# Patient Record
Sex: Female | Born: 1940 | Race: White | Hispanic: Refuse to answer | Marital: Married | Smoking: Never smoker
Health system: Southern US, Community
[De-identification: ages and names within clinical notes are randomized; demographics above are authoritative.]

## PROBLEM LIST (undated history)

## (undated) DIAGNOSIS — F411 Generalized anxiety disorder: Secondary | ICD-10-CM

## (undated) DIAGNOSIS — E039 Hypothyroidism, unspecified: Secondary | ICD-10-CM

## (undated) DIAGNOSIS — K5790 Diverticulosis of intestine, part unspecified, without perforation or abscess without bleeding: Secondary | ICD-10-CM

## (undated) DIAGNOSIS — I5189 Other ill-defined heart diseases: Secondary | ICD-10-CM

## (undated) DIAGNOSIS — H269 Unspecified cataract: Secondary | ICD-10-CM

## (undated) DIAGNOSIS — M1990 Unspecified osteoarthritis, unspecified site: Secondary | ICD-10-CM

## (undated) DIAGNOSIS — F32A Depression, unspecified: Secondary | ICD-10-CM

## (undated) DIAGNOSIS — H409 Unspecified glaucoma: Secondary | ICD-10-CM

## (undated) DIAGNOSIS — I10 Essential (primary) hypertension: Secondary | ICD-10-CM

## (undated) DIAGNOSIS — C4491 Basal cell carcinoma of skin, unspecified: Secondary | ICD-10-CM

## (undated) DIAGNOSIS — R51 Headache: Secondary | ICD-10-CM

## (undated) DIAGNOSIS — E7800 Pure hypercholesterolemia, unspecified: Secondary | ICD-10-CM

## (undated) DIAGNOSIS — F329 Major depressive disorder, single episode, unspecified: Secondary | ICD-10-CM

## (undated) DIAGNOSIS — K219 Gastro-esophageal reflux disease without esophagitis: Secondary | ICD-10-CM

## (undated) DIAGNOSIS — M8580 Other specified disorders of bone density and structure, unspecified site: Secondary | ICD-10-CM

## (undated) DIAGNOSIS — R079 Chest pain, unspecified: Secondary | ICD-10-CM

## (undated) HISTORY — DX: Basal cell carcinoma of skin, unspecified: C44.91

## (undated) HISTORY — DX: Other ill-defined heart diseases: I51.89

## (undated) HISTORY — PX: TUBAL LIGATION: SHX77

## (undated) HISTORY — DX: Chest pain, unspecified: R07.9

## (undated) HISTORY — DX: Depression, unspecified: F32.A

## (undated) HISTORY — PX: EYE SURGERY: SHX253

## (undated) HISTORY — DX: Headache: R51

## (undated) HISTORY — DX: Diverticulosis of intestine, part unspecified, without perforation or abscess without bleeding: K57.90

## (undated) HISTORY — DX: Generalized anxiety disorder: F41.1

## (undated) HISTORY — DX: Hypothyroidism, unspecified: E03.9

## (undated) HISTORY — DX: Essential (primary) hypertension: I10

## (undated) HISTORY — DX: Pure hypercholesterolemia, unspecified: E78.00

## (undated) HISTORY — DX: Unspecified cataract: H26.9

## (undated) HISTORY — DX: Unspecified glaucoma: H40.9

## (undated) HISTORY — DX: Unspecified osteoarthritis, unspecified site: M19.90

## (undated) HISTORY — DX: Other specified disorders of bone density and structure, unspecified site: M85.80

## (undated) HISTORY — DX: Major depressive disorder, single episode, unspecified: F32.9

## (undated) HISTORY — DX: Gastro-esophageal reflux disease without esophagitis: K21.9

---
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
## 1989-10-21 HISTORY — PX: TOTAL ABDOMINAL HYSTERECTOMY: SHX209

## 1989-10-21 HISTORY — PX: OTHER SURGICAL HISTORY: SHX169

## 2004-08-08 ENCOUNTER — Ambulatory Visit: Payer: Self-pay | Admitting: Internal Medicine

## 2005-06-05 ENCOUNTER — Ambulatory Visit: Payer: Self-pay | Admitting: Internal Medicine

## 2006-06-06 ENCOUNTER — Ambulatory Visit: Payer: Self-pay | Admitting: Internal Medicine

## 2007-06-09 ENCOUNTER — Ambulatory Visit: Payer: Self-pay | Admitting: Internal Medicine

## 2007-08-14 ENCOUNTER — Ambulatory Visit: Payer: Self-pay

## 2008-06-10 ENCOUNTER — Ambulatory Visit: Payer: Self-pay | Admitting: Internal Medicine

## 2008-10-21 HISTORY — PX: OTHER SURGICAL HISTORY: SHX169

## 2010-01-10 ENCOUNTER — Ambulatory Visit: Payer: Self-pay | Admitting: Family Medicine

## 2010-11-05 LAB — HM DEXA SCAN

## 2011-01-16 ENCOUNTER — Ambulatory Visit: Payer: Self-pay | Admitting: Family Medicine

## 2012-02-04 ENCOUNTER — Ambulatory Visit: Payer: Self-pay | Admitting: Family Medicine

## 2012-07-12 ENCOUNTER — Encounter: Payer: Self-pay | Admitting: *Deleted

## 2012-07-13 ENCOUNTER — Encounter: Payer: Self-pay | Admitting: Family Medicine

## 2012-07-13 ENCOUNTER — Ambulatory Visit (INDEPENDENT_AMBULATORY_CARE_PROVIDER_SITE_OTHER): Payer: Medicare Other | Admitting: Family Medicine

## 2012-07-13 VITALS — BP 140/82 | HR 52 | Temp 98.2°F | Resp 16 | Ht 63.5 in | Wt 157.6 lb

## 2012-07-13 DIAGNOSIS — E7800 Pure hypercholesterolemia, unspecified: Secondary | ICD-10-CM

## 2012-07-13 DIAGNOSIS — E039 Hypothyroidism, unspecified: Secondary | ICD-10-CM

## 2012-07-13 DIAGNOSIS — F32A Depression, unspecified: Secondary | ICD-10-CM

## 2012-07-13 DIAGNOSIS — Z23 Encounter for immunization: Secondary | ICD-10-CM

## 2012-07-13 DIAGNOSIS — I10 Essential (primary) hypertension: Secondary | ICD-10-CM

## 2012-07-13 DIAGNOSIS — F329 Major depressive disorder, single episode, unspecified: Secondary | ICD-10-CM

## 2012-07-13 LAB — T4, FREE: Free T4: 1.03 ng/dL (ref 0.80–1.80)

## 2012-07-13 LAB — CBC WITH DIFFERENTIAL/PLATELET
Basophils Absolute: 0 10*3/uL (ref 0.0–0.1)
HCT: 43.6 % (ref 36.0–46.0)
Hemoglobin: 14.7 g/dL (ref 12.0–15.0)
Lymphocytes Relative: 34 % (ref 12–46)
Monocytes Absolute: 0.4 10*3/uL (ref 0.1–1.0)
Monocytes Relative: 5 % (ref 3–12)
Neutro Abs: 4.1 10*3/uL (ref 1.7–7.7)
RDW: 13.9 % (ref 11.5–15.5)
WBC: 7.2 10*3/uL (ref 4.0–10.5)

## 2012-07-13 LAB — COMPREHENSIVE METABOLIC PANEL
Albumin: 4.4 g/dL (ref 3.5–5.2)
Alkaline Phosphatase: 88 U/L (ref 39–117)
BUN: 11 mg/dL (ref 6–23)
CO2: 26 mEq/L (ref 19–32)
Calcium: 9.7 mg/dL (ref 8.4–10.5)
Glucose, Bld: 83 mg/dL (ref 70–99)
Potassium: 4.4 mEq/L (ref 3.5–5.3)

## 2012-07-13 LAB — CK: Total CK: 126 U/L (ref 7–177)

## 2012-07-13 LAB — LIPID PANEL
Cholesterol: 178 mg/dL (ref 0–200)
Triglycerides: 185 mg/dL — ABNORMAL HIGH (ref <150)
VLDL: 37 mg/dL (ref 0–40)

## 2012-07-13 MED ORDER — LISINOPRIL 20 MG PO TABS: 20.0000 mg | ORAL_TABLET | Freq: Every day | ORAL | Status: DC

## 2012-07-13 MED ORDER — ALPRAZOLAM 0.5 MG PO TABS: 0.5000 mg | ORAL_TABLET | Freq: Every evening | ORAL | Status: DC | PRN

## 2012-07-13 NOTE — Progress Notes (Signed)
[address]   [place], [place]  [address]   [phone]  Subjective:    Patient ID: Regina Evans, female    DOB: [birth year], 71 y.o.   MRN: [id number]  HPI This 71 y.o. female presents to establish care and for follow-up:  1.  Hypertension: last visit six months ago.  No changes to management made at last visit.  Not checking blood pressure at home.   Denies chest pain, palpitations, shortness of breath, leg swelling.  Reports good compliance with medication, good tolerance of medication, good symptom control.  Needs refill of Lisinopril.  2.  Hyperlipidemia:  Last visit six months ago.  No changes made at last visit.  Reports good compliance of medication; good symptom control; good tolerance of medication.  Denies HA, focal weakness, paresthesias, dizziness.  3.  Hypothyroidism: last visit six months ago; increased Levothyroxine to one daily.  Reports good tolerance of medication; good symptom control; good tolerance to medication.  Denies weight changes, skin changes, hair changes.  4.  Depression:  Last visit six months ago; no changes in management made at last visit.  Emotionally stable; no major issues in past few months.  Would like to continue medication.  Using Alprazolam PRN insomnia; needs refill of Alprazolam.    5.  Flu vaccine:  Agreeable.  Did not check with insurance regarding Zostavax coverage.   Review of Systems  Constitutional: Negative for fever, chills, diaphoresis and fatigue.  Respiratory: Negative for cough, shortness of breath and wheezing.   Cardiovascular: Negative for chest pain, palpitations and leg swelling.  Gastrointestinal: Negative for nausea, vomiting, abdominal pain and diarrhea.  Neurological: Negative for dizziness, tremors, facial asymmetry, weakness, light-headedness, numbness and headaches.  Psychiatric/Behavioral: Positive for disturbed wake/sleep cycle and dysphoric mood. The patient is not nervous/anxious.         Past Medical  History  Diagnosis Date  . GERD (gastroesophageal reflux disease)   . Arthritis   . Osteoarthritis     hands  . Osteopenia   . Other anxiety states   . Unspecified hypothyroidism   . Headache     none since hysterectomy  . Essential hypertension, benign   . Pure hypercholesterolemia   . Unspecified glaucoma   . Depression   . Diverticulosis     no polyps in 2012 repeat colonoscopy in 5 years    Past Surgical History  Procedure Date  . Tonsillectomy 1949  . Maxillary and mandibular surgery 1991  . Total abdominal hysterectomy 1991    ovaries removed;fibroids  . Tube in right ear 2010    Prior to Admission medications   Medication Sig Start Date End Date Taking? Authorizing Provider  ALPRAZolam Prudy Feeler) 0.5 MG tablet Take 1 tablet (0.5 mg total) by mouth at bedtime as needed. NEED REFILLS 07/13/12  Yes Ethelda Chick, MD  aspirin 81 MG tablet Take 81 mg by mouth daily.   Yes Historical Provider, MD  cetirizine (ZYRTEC) 10 MG tablet Take 10 mg by mouth daily.   Yes Historical Provider, MD  citalopram (CELEXA) 20 MG tablet Take 20 mg by mouth daily.   Yes Historical Provider, MD  levothyroxine (SYNTHROID, LEVOTHROID) 75 MCG tablet Take 75 mcg by mouth daily.   Yes Historical Provider, MD  lisinopril (PRINIVIL,ZESTRIL) 20 MG tablet Take 1 tablet (20 mg total) by mouth daily. NEED REFILLS 07/13/12  Yes Ethelda Chick, MD  lovastatin (MEVACOR) 20 MG tablet Take 20 mg by mouth at bedtime.   Yes Historical  Provider, MD  timolol (BETIMOL) 0.5 % ophthalmic solution Place 1 drop into the left eye daily. USE QHS   Yes Historical Provider, MD    No Known Allergies  History   Social History  . Marital Status: Married    Spouse Name: N/A    Number of Children: 2  . Years of Education: N/A   Occupational History  . retired    Social History Main Topics  . Smoking status: Never Smoker   . Smokeless tobacco: Not on file  . Alcohol Use: Yes     moderate 5-6 drinks per week (1-2  drinks/wine or liquor each night  . Drug Use: No  . Sexually Active: Not on file   Other Topics Concern  . Not on file   Social History Narrative   Married x 50 years. Caffeine use 3 servings per day. Exercise: Inactive sporadic walking. Guns in the home stored in locked cabinet. Always uses seat belts.    Family History  Problem Relation Age of Onset  . Hypertension Mother   . Cancer Father     prostate  . Diabetes Brother   . Alzheimer's disease Mother     Hospice care  . Macular degeneration Mother   . Colon polyps Mother     Objective:   Physical Exam  Nursing note and vitals reviewed. Constitutional: She is oriented to person, place, and time. She appears well-developed and well-nourished. No distress.  HENT:  Head: Normocephalic and atraumatic.  Eyes: Conjunctivae normal are normal. Pupils are equal, round, and reactive to light.  Neck: Normal range of motion. Neck supple. No thyromegaly present.  Cardiovascular: Normal rate, regular rhythm, normal heart sounds and intact distal pulses.  Exam reveals no gallop and no friction rub.   No murmur heard. Pulmonary/Chest: Effort normal and breath sounds normal. No respiratory distress. She has no wheezes. She has no rales.  Abdominal: Soft. Bowel sounds are normal. She exhibits no distension and no mass. There is no tenderness. There is no rebound and no guarding.  Lymphadenopathy:    She has no cervical adenopathy.  Neurological: She is alert and oriented to person, place, and time. No cranial nerve deficit. She exhibits normal muscle tone.  Skin: Skin is warm and dry. No rash noted. She is not diaphoretic.  Psychiatric: She has a normal mood and affect. Her behavior is normal. Judgment and thought content normal.   FLU VACCINE ADMINISTERED BY [facility].    Assessment & Plan:   1. Pure hypercholesterolemia  Lipid panel  2. Essential hypertension, benign  CBC with Differential, CK, Comprehensive metabolic panel  3.  Unspecified hypothyroidism  TSH, T4, free  4. Need for prophylactic vaccination and inoculation against influenza  Flu vaccine greater than or equal to 3yo preservative free IM  5. Depression       1. Hypercholesterolemia:  Controlled; no change in medications; obtain labs. 2.  Hypertension: controlled; no change in medications; obtain labs.  Refill provided. 3.  Hypothyroidism: controlled; no change in medications; obtain labs. 4. Depression with anxiety: stable; no change in medications; refill provided. 5.  S/p influenza vaccine; to check with insurance regarding Zostavax coverage.

## 2012-07-13 NOTE — Patient Instructions (Addendum)
1. Pure hypercholesterolemia  Lipid panel  2. Essential hypertension, benign  CBC with Differential, CK, Comprehensive metabolic panel  3. Unspecified hypothyroidism  TSH, T4, free  4. Need for prophylactic vaccination and inoculation against influenza  Flu vaccine greater than or equal to 69yo preservative free IM  5. Depression       CALL INSURANCE REGARDING SHINGLES VACCINE COVERAGE.

## 2012-07-20 NOTE — Progress Notes (Signed)
Reviewed and agree.

## 2012-09-22 ENCOUNTER — Other Ambulatory Visit: Payer: Self-pay | Admitting: Family Medicine

## 2012-09-29 ENCOUNTER — Other Ambulatory Visit: Payer: Self-pay | Admitting: Family Medicine

## 2012-11-25 ENCOUNTER — Other Ambulatory Visit: Payer: Self-pay | Admitting: Family Medicine

## 2012-11-26 ENCOUNTER — Other Ambulatory Visit: Payer: Self-pay | Admitting: *Deleted

## 2012-11-26 MED ORDER — LOVASTATIN 20 MG PO TABS: 20.0000 mg | ORAL_TABLET | Freq: Every day | ORAL | Status: DC

## 2012-12-04 ENCOUNTER — Other Ambulatory Visit: Payer: Self-pay | Admitting: Family Medicine

## 2013-01-18 ENCOUNTER — Ambulatory Visit (INDEPENDENT_AMBULATORY_CARE_PROVIDER_SITE_OTHER): Payer: Medicare Other | Admitting: Family Medicine

## 2013-01-18 ENCOUNTER — Encounter: Payer: Self-pay | Admitting: Family Medicine

## 2013-01-18 VITALS — BP 114/76 | HR 58 | Temp 98.2°F | Resp 16 | Ht 63.5 in | Wt 162.3 lb

## 2013-01-18 DIAGNOSIS — E7800 Pure hypercholesterolemia, unspecified: Secondary | ICD-10-CM | POA: Insufficient documentation

## 2013-01-18 DIAGNOSIS — Z1239 Encounter for other screening for malignant neoplasm of breast: Secondary | ICD-10-CM | POA: Insufficient documentation

## 2013-01-18 DIAGNOSIS — N6489 Other specified disorders of breast: Secondary | ICD-10-CM | POA: Insufficient documentation

## 2013-01-18 DIAGNOSIS — I10 Essential (primary) hypertension: Secondary | ICD-10-CM | POA: Insufficient documentation

## 2013-01-18 DIAGNOSIS — E039 Hypothyroidism, unspecified: Secondary | ICD-10-CM

## 2013-01-18 DIAGNOSIS — F418 Other specified anxiety disorders: Secondary | ICD-10-CM

## 2013-01-18 DIAGNOSIS — J309 Allergic rhinitis, unspecified: Secondary | ICD-10-CM | POA: Insufficient documentation

## 2013-01-18 LAB — CBC WITH DIFFERENTIAL/PLATELET
Basophils Relative: 1 % (ref 0–1)
Eosinophils Absolute: 0.3 10*3/uL (ref 0.0–0.7)
Hemoglobin: 14.5 g/dL (ref 12.0–15.0)
Lymphs Abs: 2.4 10*3/uL (ref 0.7–4.0)
MCH: 29.7 pg (ref 26.0–34.0)
MCHC: 33.7 g/dL (ref 30.0–36.0)
Neutro Abs: 3.7 10*3/uL (ref 1.7–7.7)
Neutrophils Relative %: 54 % (ref 43–77)
Platelets: 244 10*3/uL (ref 150–400)
RBC: 4.88 MIL/uL (ref 3.87–5.11)

## 2013-01-18 LAB — COMPREHENSIVE METABOLIC PANEL
ALT: 15 U/L (ref 0–35)
Albumin: 4.2 g/dL (ref 3.5–5.2)
Alkaline Phosphatase: 82 U/L (ref 39–117)
Potassium: 4.5 mEq/L (ref 3.5–5.3)
Sodium: 140 mEq/L (ref 135–145)
Total Bilirubin: 0.4 mg/dL (ref 0.3–1.2)
Total Protein: 6.8 g/dL (ref 6.0–8.3)

## 2013-01-18 LAB — LIPID PANEL
LDL Cholesterol: 78 mg/dL (ref 0–99)
VLDL: 38 mg/dL (ref 0–40)

## 2013-01-18 LAB — TSH: TSH: 1.681 u[IU]/mL (ref 0.350–4.500)

## 2013-01-18 LAB — CK: Total CK: 108 U/L (ref 7–177)

## 2013-01-18 MED ORDER — LEVOTHYROXINE SODIUM 75 MCG PO TABS: 75.0000 ug | ORAL_TABLET | Freq: Every day | ORAL | Status: DC

## 2013-01-18 MED ORDER — LOVASTATIN 20 MG PO TABS: 20.0000 mg | ORAL_TABLET | Freq: Every day | ORAL | Status: DC

## 2013-01-18 NOTE — Assessment & Plan Note (Signed)
Controlled; no change in management; obtain labs. 

## 2013-01-18 NOTE — Patient Instructions (Addendum)
Breast cancer screening - Plan: MM Digital Screening  Essential hypertension, benign - Plan: CBC with Differential, Comprehensive metabolic panel, Lipid panel, TSH  Pure hypercholesterolemia - Plan: Lipid panel  Unspecified hypothyroidism - Plan: Comprehensive metabolic panel, TSH, CK

## 2013-01-18 NOTE — Assessment & Plan Note (Signed)
Controlled; obtain labs; continue current medications. 

## 2013-01-18 NOTE — Assessment & Plan Note (Signed)
Stable; recently decreased medication to 1/2 tablet daily; doing well.  Does not desire trial off of medication.

## 2013-01-18 NOTE — Assessment & Plan Note (Signed)
Controlled; obtain labs; refill provided. 

## 2013-01-18 NOTE — Assessment & Plan Note (Signed)
Worsening; restarted Zyrtec recently.

## 2013-01-18 NOTE — Progress Notes (Signed)
[facility]  Subjective:    Patient ID: Regina Evans, female    DOB: 1941/01/08, 72 y.o.   MRN: [id number]  HPI This 72 y.o. female presents for evaluation of the following:  1. HTN:  BP running unknown; no high or low readings.  Reports compliance with medication; good tolerance to medication; good symptom control. Denies CP/palp/SOB/leg swelling. Denies HA/paresthesias/focal weakness/dizziness.  2.  Allergic Rhinitis:  Zyrtec daily. Has started suffering with nasal congestion, rhinorrhea, sneezing, PND.  Stable at this time.  3. Hypothyroidism: six month follow-up; no changes to management made at last visit; TSH therapeutic at last visit; reports compliance with medication; good tolerance to medication; good symptom control.  4.  Hypercholesterolemia: fasting.  Six month follow-up; no changes to management made at last visit; reports compliance with medication; good tolerance to medication; good symptom control.  5.  Depression and anxiety:  Reduced Citalopram to 1/2 tablet daily two weeks ago; doing well emotionally; not interested in completely stopping medication.   6.  Health Maintenance:  Needs to schedule mammogram.   [facility].  Due for CPE.  7.  R breast asymmetry:  No palpable lump but breast on R lateral aspect feels different.   Review of Systems  Constitutional: Negative for fever, chills, diaphoresis and fatigue.  HENT: Positive for congestion, rhinorrhea, sneezing and postnasal drip. Negative for ear pain and sinus pressure.   Respiratory: Negative for shortness of breath.   Cardiovascular: Negative for chest pain, palpitations and leg swelling.  Neurological: Negative for dizziness, tremors, seizures, syncope, facial asymmetry, speech difficulty, weakness, light-headedness, numbness and headaches.  Psychiatric/Behavioral: Positive for dysphoric mood. The patient is nervous/anxious.         Past Medical History  Diagnosis Date  . GERD (gastroesophageal reflux  disease)   . Arthritis   . Osteoarthritis     hands  . Osteopenia   . Other anxiety states   . Unspecified hypothyroidism   . Headache     none since hysterectomy  . Essential hypertension, benign   . Pure hypercholesterolemia   . Unspecified glaucoma(365.9)   . Depression   . Diverticulosis     no polyps in 2012 repeat colonoscopy in 5 years  . Cataract     Past Surgical History  Procedure Laterality Date  . Tonsillectomy  1949  . Maxillary and mandibular surgery  1991  . Total abdominal hysterectomy  1991    ovaries removed;fibroids  . Tube in right ear  2010  . Tubal ligation      Prior to Admission medications   Medication Sig Start Date End Date Taking? Authorizing Provider  ALPRAZolam Prudy Feeler) 0.5 MG tablet Take 1 tablet (0.5 mg total) by mouth at bedtime as needed. NEED REFILLS 07/13/12  Yes Ethelda Chick, MD  aspirin 81 MG tablet Take 81 mg by mouth daily.   Yes Historical Provider, MD  cetirizine (ZYRTEC) 10 MG tablet Take 10 mg by mouth daily.   Yes Historical Provider, MD  citalopram (CELEXA) 20 MG tablet Take 20 mg by mouth daily.   Yes Historical Provider, MD  levothyroxine (SYNTHROID, LEVOTHROID) 75 MCG tablet Take 1 tablet (75 mcg total) by mouth daily. 01/18/13  Yes Ethelda Chick, MD  lisinopril (PRINIVIL,ZESTRIL) 20 MG tablet Take 1 tablet (20 mg total) by mouth daily. NEED REFILLS 07/13/12  Yes Ethelda Chick, MD  lovastatin (MEVACOR) 20 MG tablet Take 1 tablet (20 mg total) by mouth at bedtime. 01/18/13  Yes Ethelda Chick, MD  timolol (BETIMOL) 0.5 % ophthalmic solution Place 1 drop into the left eye daily. USE QHS   Yes Historical Provider, MD    No Known Allergies  History   Social History  . Marital Status: Married    Spouse Name: N/A    Number of Children: 2  . Years of Education: N/A   Occupational History  . retired    Social History Main Topics  . Smoking status: Never Smoker   . Smokeless tobacco: Not on file  . Alcohol Use: Yes      Comment: moderate 5-6 drinks per week (1-2 drinks/wine or liquor each night  . Drug Use: No  . Sexually Active: Yes    Birth Control/ Protection: None     Comment: number of sex partners in the last 12 months  1   Other Topics Concern  . Not on file   Social History Narrative   Married x 50 years. Caffeine use 3 servings per day. Exercise: Inactive sporadic walking. Guns in the home stored in locked cabinet. Always uses seat belts.    Family History  Problem Relation Age of Onset  . Hypertension Mother   . Alzheimer's disease Mother     Hospice care  . Macular degeneration Mother   . Colon polyps Mother   . Cancer Father     prostate  . Diabetes Brother   . Alzheimer's disease Maternal Grandmother   . Cancer Paternal Grandmother   . Dementia Paternal Grandfather   . Cirrhosis Paternal Grandfather     Objective:   Physical Exam  Nursing note and vitals reviewed. Constitutional: She is oriented to person, place, and time. She appears well-developed and well-nourished. No distress.  HENT:  Mouth/Throat: Oropharynx is clear and moist.  Eyes: Conjunctivae and EOM are normal. Pupils are equal, round, and reactive to light.  Neck: Normal range of motion. Neck supple. No JVD present. No tracheal deviation present. No thyromegaly present.  Cardiovascular: Normal rate, regular rhythm and normal heart sounds.  Exam reveals no gallop and no friction rub.   No murmur heard. Pulmonary/Chest: Effort normal and breath sounds normal. She has no wheezes. She has no rales.  Genitourinary: No breast swelling, tenderness, discharge or bleeding.  Lymphadenopathy:    She has no cervical adenopathy.  Neurological: She is alert and oriented to person, place, and time.  Skin: She is not diaphoretic.  Psychiatric: She has a normal mood and affect. Her behavior is normal. Judgment and thought content normal.       Assessment & Plan:  Breast cancer screening - Plan: MM Digital  Screening  Essential hypertension, benign - Plan: CBC with Differential, Comprehensive metabolic panel, Lipid panel, TSH  Pure hypercholesterolemia - Plan: Lipid panel  Unspecified hypothyroidism - Plan: Comprehensive metabolic panel, TSH, CK  Depression with anxiety  Allergic rhinitis  Breast asymmetry   Meds ordered this encounter  Medications  . levothyroxine (SYNTHROID, LEVOTHROID) 75 MCG tablet    Sig: Take 1 tablet (75 mcg total) by mouth daily.    Dispense:  90 tablet    Refill:  3  . lovastatin (MEVACOR) 20 MG tablet    Sig: Take 1 tablet (20 mg total) by mouth at bedtime.    Dispense:  90 tablet    Refill:  3   s

## 2013-01-18 NOTE — Assessment & Plan Note (Signed)
New. Benign breast exam; refer for annual mammogram.

## 2013-01-18 NOTE — Assessment & Plan Note (Signed)
Refer for mammogram at [facility].

## 2013-02-25 ENCOUNTER — Ambulatory Visit: Payer: Self-pay | Admitting: Family Medicine

## 2013-03-16 ENCOUNTER — Encounter: Payer: Self-pay | Admitting: Family Medicine

## 2013-03-17 NOTE — Telephone Encounter (Signed)
error 

## 2013-03-19 MED ORDER — HYDROCORTISONE ACETATE 25 MG RE SUPP: 25.0000 mg | Freq: Two times a day (BID) | RECTAL | Status: DC

## 2013-03-19 NOTE — Addendum Note (Signed)
Addended by: Ethelda Chick on: 03/19/2013 12:27 PM   Modules accepted: Orders

## 2013-03-20 ENCOUNTER — Encounter: Payer: Self-pay | Admitting: Family Medicine

## 2013-03-24 ENCOUNTER — Ambulatory Visit: Payer: Medicare Other | Admitting: Family Medicine

## 2013-04-08 ENCOUNTER — Encounter: Payer: Self-pay | Admitting: Radiology

## 2013-04-08 ENCOUNTER — Telehealth: Payer: Self-pay | Admitting: Radiology

## 2013-04-08 NOTE — Telephone Encounter (Signed)
mychart message sent to patient mammogram normal

## 2013-04-15 ENCOUNTER — Encounter: Payer: Self-pay | Admitting: Family Medicine

## 2013-05-26 ENCOUNTER — Other Ambulatory Visit: Payer: Self-pay

## 2013-06-15 IMAGING — MG MM CAD SCREENING MAMMO
1 series · 5 of 5 positions shown · non-contrast
Comparison: none

REASON FOR EXAM: SCR MAMMO NO ORDER
COMMENTS:

PROCEDURE:     MAM - MAM DGTL SCRN MAM NO ORDER W/CAD  - February 25, 2013  [DATE]
RESULT:     Bilateral digital screening mammogram with CAD dated 02/25/2013
Comparison study/studies dated:
02/04/2012, 01/16/2011, 01/10/2010, 06/17/2008

[R CC · right · 5 of 5 slices shown]
[im 1/5]
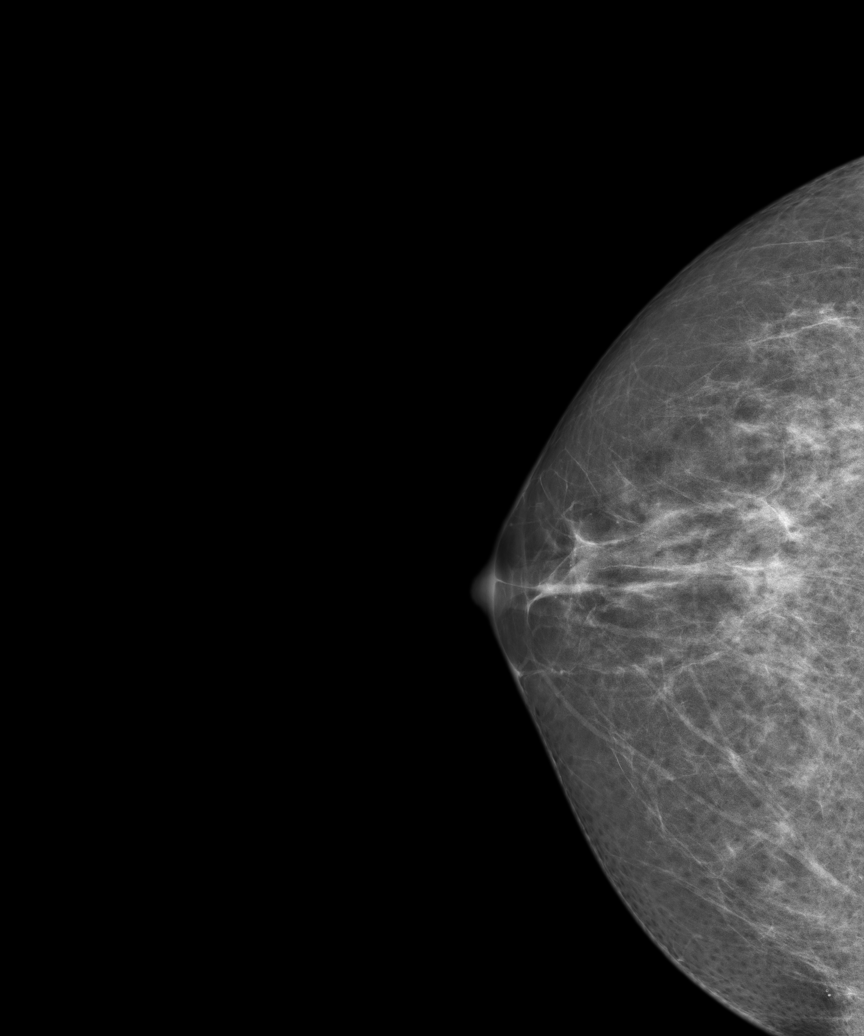
[im 2/5]
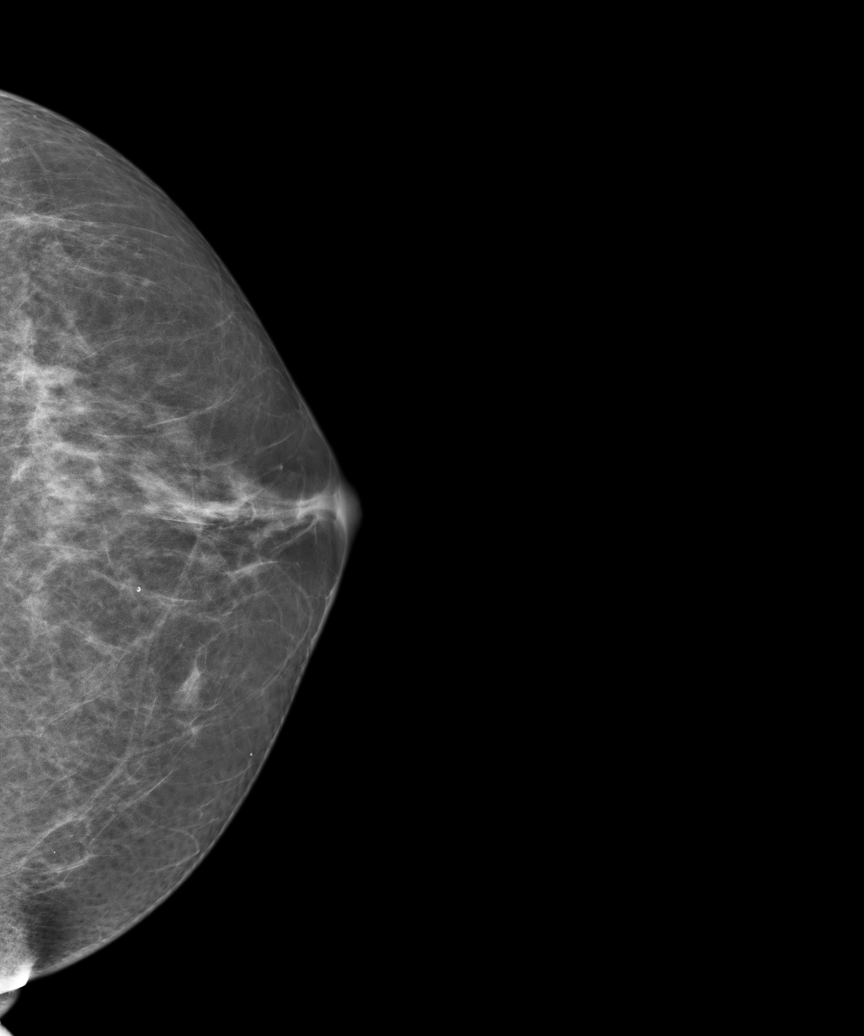
[im 3/5]
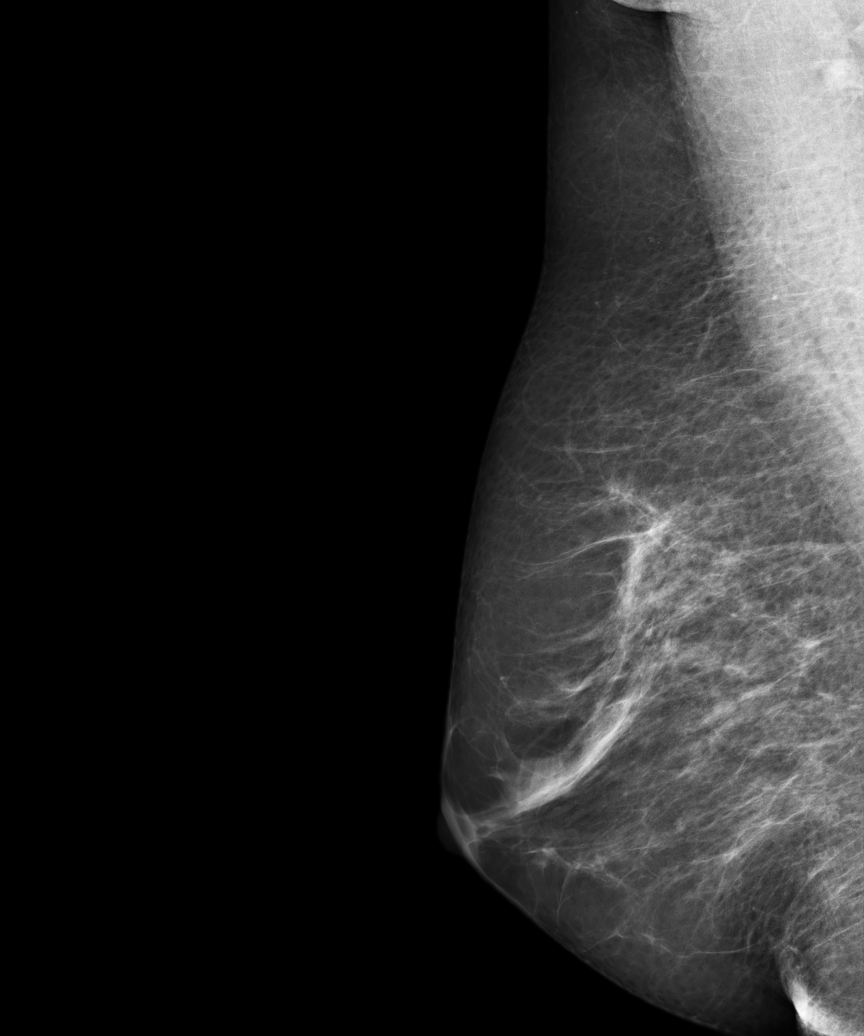
[im 4/5]
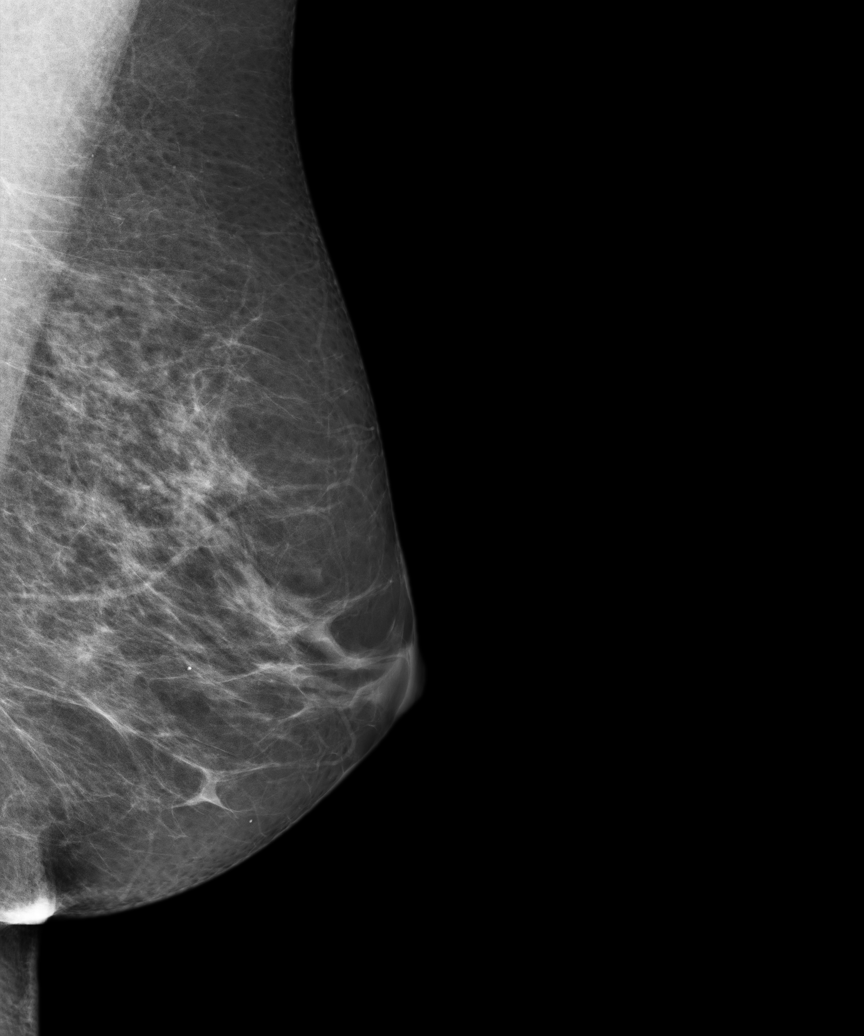
[im 5/5]
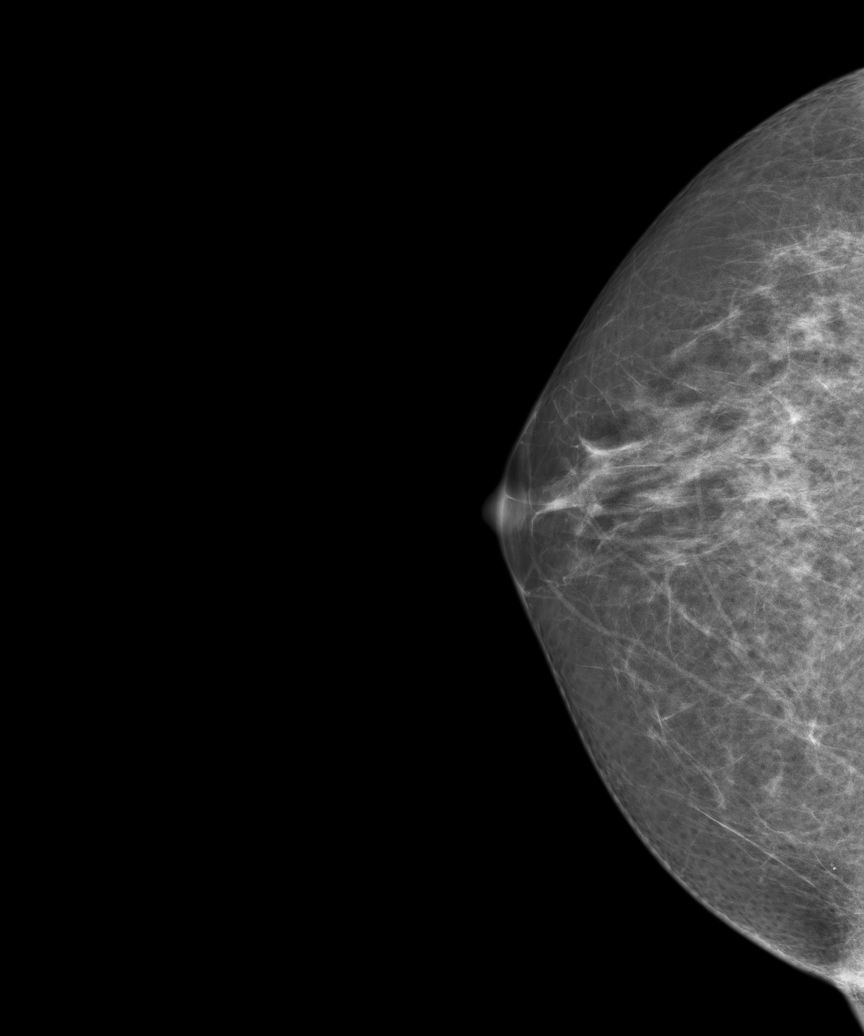

[5 of 5 positions shown; findings below may reference images not displayed]

FINDINGS: BREAST COMPOSITION: The breast composition is SCATTERED FIBROGLANDULAR
TISSUE (glandular tissue is 25-50%)

There is no radiographic evidence of malignant type calcifications ,
architectural distortion, spiculated masses or nodules.
IMPRESSION: BI-RADS: Category 2- Benign Finding

A NEGATIVE MAMMOGRAM REPORT DOES NOT PRECLUDE BIOPSY OR OTHER EVALUATION OF
A CLINICALLY PALPABLE OR OTHERWISE SUSPICIOUS MASS OR LESION. BREAST CANCER
MAY NOT BE DETECTED IN UP TO 10% OF CASES.

## 2013-08-02 ENCOUNTER — Encounter: Payer: Medicare Other | Admitting: Family Medicine

## 2013-08-11 ENCOUNTER — Ambulatory Visit (INDEPENDENT_AMBULATORY_CARE_PROVIDER_SITE_OTHER): Payer: Medicare Other | Admitting: Family Medicine

## 2013-08-11 ENCOUNTER — Encounter: Payer: Self-pay | Admitting: Family Medicine

## 2013-08-11 VITALS — HR 63 | Temp 98.4°F | Resp 16 | Ht 63.0 in | Wt 163.0 lb

## 2013-08-11 DIAGNOSIS — Z23 Encounter for immunization: Secondary | ICD-10-CM

## 2013-08-11 DIAGNOSIS — E039 Hypothyroidism, unspecified: Secondary | ICD-10-CM

## 2013-08-11 DIAGNOSIS — Z131 Encounter for screening for diabetes mellitus: Secondary | ICD-10-CM

## 2013-08-11 DIAGNOSIS — N898 Other specified noninflammatory disorders of vagina: Secondary | ICD-10-CM

## 2013-08-11 DIAGNOSIS — E7800 Pure hypercholesterolemia, unspecified: Secondary | ICD-10-CM

## 2013-08-11 DIAGNOSIS — I10 Essential (primary) hypertension: Secondary | ICD-10-CM

## 2013-08-11 DIAGNOSIS — N949 Unspecified condition associated with female genital organs and menstrual cycle: Secondary | ICD-10-CM

## 2013-08-11 DIAGNOSIS — Z0000 Encounter for general adult medical examination without abnormal findings: Secondary | ICD-10-CM

## 2013-08-11 DIAGNOSIS — F329 Major depressive disorder, single episode, unspecified: Secondary | ICD-10-CM

## 2013-08-11 LAB — CBC WITH DIFFERENTIAL/PLATELET
Eosinophils Relative: 4 % (ref 0–5)
HCT: 41.8 % (ref 36.0–46.0)
Hemoglobin: 14.2 g/dL (ref 12.0–15.0)
Lymphocytes Relative: 38 % (ref 12–46)
MCHC: 34 g/dL (ref 30.0–36.0)
MCV: 88.9 fL (ref 78.0–100.0)
Monocytes Absolute: 0.4 10*3/uL (ref 0.1–1.0)
Monocytes Relative: 4 % (ref 3–12)
Neutro Abs: 4.5 10*3/uL (ref 1.7–7.7)
WBC: 8.4 10*3/uL (ref 4.0–10.5)

## 2013-08-11 LAB — POCT WET PREP WITH KOH
Epithelial Wet Prep HPF POC: NEGATIVE
KOH Prep POC: NEGATIVE
Trichomonas, UA: NEGATIVE
Yeast Wet Prep HPF POC: NEGATIVE

## 2013-08-11 LAB — POCT URINALYSIS DIPSTICK
Glucose, UA: NEGATIVE
Leukocytes, UA: NEGATIVE
Nitrite, UA: NEGATIVE
Urobilinogen, UA: 0.2

## 2013-08-11 MED ORDER — ALPRAZOLAM 0.5 MG PO TABS: 0.5000 mg | ORAL_TABLET | Freq: Every evening | ORAL | Status: DC | PRN

## 2013-08-11 MED ORDER — LISINOPRIL 20 MG PO TABS: 20.0000 mg | ORAL_TABLET | Freq: Every day | ORAL | Status: DC

## 2013-08-11 MED ORDER — LEVOTHYROXINE SODIUM 75 MCG PO TABS: 75.0000 ug | ORAL_TABLET | Freq: Every day | ORAL | Status: DC

## 2013-08-11 MED ORDER — LOVASTATIN 20 MG PO TABS: 20.0000 mg | ORAL_TABLET | Freq: Every day | ORAL | Status: DC

## 2013-08-11 MED ORDER — AMOXICILLIN 500 MG PO CAPS: 1000.0000 mg | ORAL_CAPSULE | Freq: Two times a day (BID) | ORAL | Status: DC

## 2013-08-11 MED ORDER — CITALOPRAM HYDROBROMIDE 20 MG PO TABS: 20.0000 mg | ORAL_TABLET | Freq: Every day | ORAL | Status: DC

## 2013-08-11 MED ORDER — ZOSTER VACCINE LIVE 19400 UNT/0.65ML ~~LOC~~ SOLR: 0.6500 mL | Freq: Once | SUBCUTANEOUS | Status: DC

## 2013-08-11 MED ORDER — FLUTICASONE PROPIONATE 50 MCG/ACT NA SUSP: 2.0000 | Freq: Every day | NASAL | Status: DC

## 2013-08-11 NOTE — Progress Notes (Signed)
[address]   [place], [place]  [address]   [phone]  Subjective:    Patient ID: Regina Evans, female    DOB: 05/18/1941, 72 y.o.   MRN: [id number]  HPI This 72 y.o. female presents for Annual Wellness Exam.  Last physical over one year ago. Pap smear 2013. Mammogram 02/2013 normal. Colonoscopy 12/17/10. Bone density scan unknown. Tdap 07/10/11. Pneumovax Menn. Flu vaccine 2014. Eye exam; early cataracts; +glasses.  Dingeldein. Dental exam  [facility] every six months.   HTN:  Not checking at home.  Reports good compliance with medication; good tolerance to medication; good symptom control.  Hyperlipidema: controlled; good compliance with medication; good tolerance to medication; good symptom control.   Hypothyroidism:  Stable; compliance with medication; due for labs.   Review of Systems  Constitutional: Negative.   HENT: Negative.   Eyes: Negative.   Respiratory: Negative.   Cardiovascular: Negative.   Gastrointestinal: Negative.   Endocrine: Negative.   Genitourinary: Negative.   Musculoskeletal: Negative.   Skin: Negative.   Allergic/Immunologic: Negative.   Neurological: Negative.   Hematological: Negative.   Psychiatric/Behavioral: Negative.    Past Medical History  Diagnosis Date  . GERD (gastroesophageal reflux disease)   . Arthritis   . Osteoarthritis     hands  . Osteopenia   . Other anxiety states   . Unspecified hypothyroidism   . Headache(784.0)     none since hysterectomy  . Essential hypertension, benign   . Pure hypercholesterolemia   . Unspecified glaucoma(365.9)   . Depression   . Diverticulosis     no polyps in 2012 repeat colonoscopy in 5 years  . Cataract    Past Surgical History  Procedure Laterality Date  . Tonsillectomy  1949  . Maxillary and mandibular surgery  1991  . Total abdominal hysterectomy  1991    ovaries removed;fibroids  . Tube in right ear  2010  . Tubal ligation     No Known Allergies History   Social History    . Marital Status: Married    Spouse Name: N/A    Number of Children: 2  . Years of Education: N/A   Occupational History  . retired    Social History Main Topics  . Smoking status: Never Smoker   . Smokeless tobacco: Not on file  . Alcohol Use: Yes     Comment: moderate 5-6 drinks per week (1-2 drinks/wine or liquor each night  . Drug Use: No  . Sexual Activity: Yes    Birth Control/ Protection: None     Comment: number of sex partners in the last 12 months  1   Other Topics Concern  . Not on file   Social History Narrative   Married x 51 years.       Children 2 daughters (22, 49); no grandchildren.      Lives:  With husband.      Employment:  Retired.      Tobacco:       Alcohol:       Caffeine use 3 servings per day.       Exercise: Inactive sporadic walking.       Guns in the home stored in locked cabinet. Always uses seat belts.   Family History  Problem Relation Age of Onset  . Hypertension Mother   . Alzheimer's disease Mother     Hospice care  . Macular degeneration Mother   . Colon polyps Mother   . Cancer Father  prostate  . Diabetes Brother   . Alzheimer's disease Maternal Grandmother   . Cancer Paternal Grandmother   . Dementia Paternal Grandfather   . Cirrhosis Paternal Grandfather        Objective:   Physical Exam  Nursing note and vitals reviewed. Constitutional: She is oriented to person, place, and time. She appears well-developed and well-nourished. No distress.  HENT:  Head: Normocephalic and atraumatic.  Right Ear: External ear normal.  Left Ear: External ear normal.  Nose: Nose normal.  Mouth/Throat: Oropharynx is clear and moist.  Eyes: Conjunctivae and EOM are normal. Pupils are equal, round, and reactive to light.  Neck: Normal range of motion and full passive range of motion without pain. Neck supple. No JVD present. Carotid bruit is not present. No thyromegaly present.  Cardiovascular: Normal rate, regular rhythm and normal  heart sounds.  Exam reveals no gallop and no friction rub.   No murmur heard. Pulmonary/Chest: Effort normal and breath sounds normal. She has no wheezes. She has no rales.  Abdominal: Soft. Bowel sounds are normal. She exhibits no distension and no mass. There is no tenderness. There is no rebound and no guarding.  Genitourinary: Vagina normal. No breast swelling, tenderness, discharge or bleeding. Pelvic exam was performed with patient supine. There is no rash, tenderness or lesion on the right labia. There is no rash, tenderness or lesion on the left labia. Right adnexum displays no mass, no tenderness and no fullness. Left adnexum displays no mass, no tenderness and no fullness.  Musculoskeletal:       Right shoulder: Normal.       Left shoulder: Normal.       Cervical back: Normal.  Lymphadenopathy:    She has no cervical adenopathy.  Neurological: She is alert and oriented to person, place, and time. She has normal reflexes. No cranial nerve deficit. She exhibits normal muscle tone. Coordination normal.  Skin: Skin is warm and dry. No rash noted. She is not diaphoretic. No erythema. No pallor.  Psychiatric: She has a normal mood and affect. Her behavior is normal. Judgment and thought content normal.       Assessment & Plan:  Routine general medical examination at a health care facility - Plan: CBC with Differential, Comprehensive metabolic panel, CK, TSH, T4, free, Hemoglobin A1c, Lipid panel, POCT urinalysis dipstick, EKG 12-Lead  Essential hypertension, benign - Plan: CBC with Differential, Comprehensive metabolic panel, CK  Pure hypercholesterolemia - Plan: Lipid panel  Unspecified hypothyroidism - Plan: TSH, T4, free  Screening for diabetes mellitus - Plan: Hemoglobin A1c  Need for prophylactic vaccination and inoculation against influenza - Plan: Flu Vaccine QUAD 36+ mos IM  1.  CPE: anticipatory guidance provided --- weight loss, exercise.  No longer warrants pap smears due  to hysterectomy status and age.  Mammogram UTD; colonoscopy UTD. Immunizations UTD.  Low fall risk; being treated for depression; independent with ADLs; no hearing loss.  Good family support.   2.  Gynecological exam: completed; mammogram UTD; no longer warrants pap smears. 3. HTN: moderately controlled; check BP weekly; obtain labs, u/a, EKG. 4. Hyperlipidemia: controlled; obtain labs; refills provided. 5. Anxiety with depression: controlled; refills provided. 6.  Hypothyroidism: controlled; obtain labs; refill provided. 7.  S/p flu vaccine in office.  Meds ordered this encounter  Medications  . lovastatin (MEVACOR) 20 MG tablet    Sig: Take 1 tablet (20 mg total) by mouth at bedtime.    Dispense:  90 tablet    Refill:  3  . lisinopril (PRINIVIL,ZESTRIL) 20 MG tablet    Sig: Take 1 tablet (20 mg total) by mouth daily.    Dispense:  90 tablet    Refill:  3  . levothyroxine (SYNTHROID, LEVOTHROID) 75 MCG tablet    Sig: Take 1 tablet (75 mcg total) by mouth daily.    Dispense:  90 tablet    Refill:  3  . citalopram (CELEXA) 20 MG tablet    Sig: Take 1 tablet (20 mg total) by mouth daily.    Dispense:  90 tablet    Refill:  3  . ALPRAZolam (XANAX) 0.5 MG tablet    Sig: Take 1 tablet (0.5 mg total) by mouth at bedtime as needed.    Dispense:  30 tablet    Refill:  3   Nilda Simmer, M.D.  [facility] [address] [place], [place]  [address] [phone] phone [phone] fax

## 2013-08-12 LAB — COMPREHENSIVE METABOLIC PANEL
Albumin: 4.6 g/dL (ref 3.5–5.2)
BUN: 11 mg/dL (ref 6–23)
Calcium: 10 mg/dL (ref 8.4–10.5)
Chloride: 104 mEq/L (ref 96–112)
Creat: 0.87 mg/dL (ref 0.50–1.10)
Glucose, Bld: 79 mg/dL (ref 70–99)
Potassium: 4.4 mEq/L (ref 3.5–5.3)

## 2013-08-12 LAB — LIPID PANEL
LDL Cholesterol: 80 mg/dL (ref 0–99)
VLDL: 32 mg/dL (ref 0–40)

## 2013-08-12 LAB — CK: Total CK: 198 U/L — ABNORMAL HIGH (ref 7–177)

## 2013-08-12 LAB — T4, FREE: Free T4: 1.02 ng/dL (ref 0.80–1.80)

## 2013-08-12 LAB — TSH: TSH: 6.859 u[IU]/mL — ABNORMAL HIGH (ref 0.350–4.500)

## 2013-08-13 ENCOUNTER — Encounter: Payer: Self-pay | Admitting: Family Medicine

## 2013-08-13 MED ORDER — METRONIDAZOLE 0.75 % VA GEL: 1.0000 | Freq: Two times a day (BID) | VAGINAL | Status: DC

## 2013-08-20 ENCOUNTER — Encounter: Payer: Self-pay | Admitting: Family Medicine

## 2013-09-01 ENCOUNTER — Encounter: Payer: Self-pay | Admitting: Family Medicine

## 2014-01-12 ENCOUNTER — Encounter: Payer: Self-pay | Admitting: Family Medicine

## 2014-01-13 ENCOUNTER — Encounter: Payer: Self-pay | Admitting: Family Medicine

## 2014-02-02 ENCOUNTER — Encounter: Payer: Self-pay | Admitting: Family Medicine

## 2014-02-09 ENCOUNTER — Ambulatory Visit: Payer: Medicare Other | Admitting: Family Medicine

## 2014-02-21 ENCOUNTER — Ambulatory Visit (INDEPENDENT_AMBULATORY_CARE_PROVIDER_SITE_OTHER): Payer: Medicare Other | Admitting: Family Medicine

## 2014-02-21 ENCOUNTER — Encounter: Payer: Self-pay | Admitting: Family Medicine

## 2014-02-21 VITALS — BP 120/76 | HR 47 | Temp 98.2°F | Resp 16 | Ht 63.75 in | Wt 159.0 lb

## 2014-02-21 DIAGNOSIS — E039 Hypothyroidism, unspecified: Secondary | ICD-10-CM

## 2014-02-21 DIAGNOSIS — E7800 Pure hypercholesterolemia, unspecified: Secondary | ICD-10-CM

## 2014-02-21 DIAGNOSIS — R05 Cough: Secondary | ICD-10-CM

## 2014-02-21 DIAGNOSIS — R7309 Other abnormal glucose: Secondary | ICD-10-CM

## 2014-02-21 DIAGNOSIS — J309 Allergic rhinitis, unspecified: Secondary | ICD-10-CM

## 2014-02-21 DIAGNOSIS — R059 Cough, unspecified: Secondary | ICD-10-CM

## 2014-02-21 DIAGNOSIS — F341 Dysthymic disorder: Secondary | ICD-10-CM

## 2014-02-21 DIAGNOSIS — M25519 Pain in unspecified shoulder: Secondary | ICD-10-CM

## 2014-02-21 DIAGNOSIS — K219 Gastro-esophageal reflux disease without esophagitis: Secondary | ICD-10-CM

## 2014-02-21 DIAGNOSIS — F418 Other specified anxiety disorders: Secondary | ICD-10-CM

## 2014-02-21 DIAGNOSIS — I10 Essential (primary) hypertension: Secondary | ICD-10-CM

## 2014-02-21 LAB — CBC WITH DIFFERENTIAL/PLATELET
BASOS ABS: 0.1 10*3/uL (ref 0.0–0.1)
Basophils Relative: 1 % (ref 0–1)
EOS PCT: 3 % (ref 0–5)
Eosinophils Absolute: 0.2 10*3/uL (ref 0.0–0.7)
HCT: 40.1 % (ref 36.0–46.0)
Hemoglobin: 13.6 g/dL (ref 12.0–15.0)
Lymphocytes Relative: 38 % (ref 12–46)
Lymphs Abs: 2.5 10*3/uL (ref 0.7–4.0)
MCH: 29.6 pg (ref 26.0–34.0)
MCHC: 33.9 g/dL (ref 30.0–36.0)
MCV: 87.4 fL (ref 78.0–100.0)
MONO ABS: 0.3 10*3/uL (ref 0.1–1.0)
Monocytes Relative: 4 % (ref 3–12)
NEUTROS ABS: 3.6 10*3/uL (ref 1.7–7.7)
Neutrophils Relative %: 54 % (ref 43–77)
PLATELETS: 262 10*3/uL (ref 150–400)
RBC: 4.59 MIL/uL (ref 3.87–5.11)
RDW: 14.8 % (ref 11.5–15.5)
WBC: 6.7 10*3/uL (ref 4.0–10.5)

## 2014-02-21 LAB — COMPLETE METABOLIC PANEL WITH GFR
ALK PHOS: 83 U/L (ref 39–117)
ALT: 20 U/L (ref 0–35)
AST: 26 U/L (ref 0–37)
Albumin: 4.5 g/dL (ref 3.5–5.2)
BUN: 11 mg/dL (ref 6–23)
CO2: 26 mEq/L (ref 19–32)
CREATININE: 0.91 mg/dL (ref 0.50–1.10)
Calcium: 9.7 mg/dL (ref 8.4–10.5)
Chloride: 106 mEq/L (ref 96–112)
GFR, Est African American: 74 mL/min
GFR, Est Non African American: 64 mL/min
Glucose, Bld: 93 mg/dL (ref 70–99)
Potassium: 4.7 mEq/L (ref 3.5–5.3)
Sodium: 140 mEq/L (ref 135–145)
Total Bilirubin: 0.5 mg/dL (ref 0.2–1.2)
Total Protein: 7.2 g/dL (ref 6.0–8.3)

## 2014-02-21 LAB — LIPID PANEL
CHOLESTEROL: 162 mg/dL (ref 0–200)
HDL: 51 mg/dL (ref >39)
LDL Cholesterol: 80 mg/dL (ref 0–99)
TRIGLYCERIDES: 154 mg/dL — AB (ref <150)
Total CHOL/HDL Ratio: 3.2 Ratio
VLDL: 31 mg/dL (ref 0–40)

## 2014-02-21 MED ORDER — MELOXICAM 15 MG PO TABS: 15.0000 mg | ORAL_TABLET | Freq: Every day | ORAL | Status: DC

## 2014-02-21 NOTE — Patient Instructions (Signed)
1. Start Prilosec 20mg  one tablet daily for one month; take one hour before a meal. 2. Start Zyrtec 10mg  one tablet daily.   Impingement Syndrome, Rotator Cuff, Bursitis with Rehab Impingement syndrome is a condition that involves inflammation of the tendons of the rotator cuff and the subacromial bursa, that causes pain in the shoulder. The rotator cuff consists of four tendons and muscles that control much of the shoulder and upper arm function. The subacromial bursa is a fluid filled sac that helps reduce friction between the rotator cuff and one of the bones of the shoulder (acromion). Impingement syndrome is usually an overuse injury that causes swelling of the bursa (bursitis), swelling of the tendon (tendonitis), and/or a tear of the tendon (strain). Strains are classified into three categories. Grade 1 strains cause pain, but the tendon is not lengthened. Grade 2 strains include a lengthened ligament, due to the ligament being stretched or partially ruptured. With grade 2 strains there is still function, although the function may be decreased. Grade 3 strains include a complete tear of the tendon or muscle, and function is usually impaired. SYMPTOMS   Pain around the shoulder, often at the outer portion of the upper arm.  Pain that gets worse with shoulder function, especially when reaching overhead or lifting.  Sometimes, aching when not using the arm.  Pain that wakes you up at night.  Sometimes, tenderness, swelling, warmth, or redness over the affected area.  Loss of strength.  Limited motion of the shoulder, especially reaching behind the back (to the back pocket or to unhook bra) or across your body.  Crackling sound (crepitation) when moving the arm.  Biceps tendon pain and inflammation (in the front of the shoulder). Worse when bending the elbow or lifting. CAUSES  Impingement syndrome is often an overuse injury, in which chronic (repetitive) motions cause the tendons or  bursa to become inflamed. A strain occurs when a force is paced on the tendon or muscle that is greater than it can withstand. Common mechanisms of injury include: Stress from sudden increase in duration, frequency, or intensity of training.  Direct hit (trauma) to the shoulder.  Aging, erosion of the tendon with normal use.  Bony bump on shoulder (acromial spur). RISK INCREASES WITH:  Contact sports (football, wrestling, boxing).  Throwing sports (baseball, tennis, volleyball).  Weightlifting and bodybuilding.  Heavy labor.  Previous injury to the rotator cuff, including impingement.  Poor shoulder strength and flexibility.  Failure to warm up properly before activity.  Inadequate protective equipment.  Old age.  Bony bump on shoulder (acromial spur). PREVENTION   Warm up and stretch properly before activity.  Allow for adequate recovery between workouts.  Maintain physical fitness:  Strength, flexibility, and endurance.  Cardiovascular fitness.  Learn and use proper exercise technique. PROGNOSIS  If treated properly, impingement syndrome usually goes away within 6 weeks. Sometimes surgery is required.  RELATED COMPLICATIONS   Longer healing time if not properly treated, or if not given enough time to heal.  Recurring symptoms, that result in a chronic condition.  Shoulder stiffness, frozen shoulder, or loss of motion.  Rotator cuff tendon tear.  Recurring symptoms, especially if activity is resumed too soon, with overuse, with a direct blow, or when using poor technique. TREATMENT  Treatment first involves the use of ice and medicine, to reduce pain and inflammation. The use of strengthening and stretching exercises may help reduce pain with activity. These exercises may be performed at home or with a therapist.  If non-surgical treatment is unsuccessful after more than 6 months, surgery may be advised. After surgery and rehabilitation, activity is usually  possible in 3 months.  MEDICATION  If pain medicine is needed, nonsteroidal anti-inflammatory medicines (aspirin and ibuprofen), or other minor pain relievers (acetaminophen), are often advised.  Do not take pain medicine for 7 days before surgery.  Prescription pain relievers may be given, if your caregiver thinks they are needed. Use only as directed and only as much as you need.  Corticosteroid injections may be given by your caregiver. These injections should be reserved for the most serious cases, because they may only be given a certain number of times. HEAT AND COLD  Cold treatment (icing) should be applied for 10 to 15 minutes every 2 to 3 hours for inflammation and pain, and immediately after activity that aggravates your symptoms. Use ice packs or an ice massage.  Heat treatment may be used before performing stretching and strengthening activities prescribed by your caregiver, physical therapist, or athletic trainer. Use a heat pack or a warm water soak. SEEK MEDICAL CARE IF:   Symptoms get worse or do not improve in 4 to 6 weeks, despite treatment.  New, unexplained symptoms develop. (Drugs used in treatment may produce side effects.) EXERCISES  RANGE OF MOTION (ROM) AND STRETCHING EXERCISES - Impingement Syndrome (Rotator Cuff  Tendinitis, Bursitis) These exercises may help you when beginning to rehabilitate your injury. Your symptoms may go away with or without further involvement from your physician, physical therapist or athletic trainer. While completing these exercises, remember:   Restoring tissue flexibility helps normal motion to return to the joints. This allows healthier, less painful movement and activity.  An effective stretch should be held for at least 30 seconds.  A stretch should never be painful. You should only feel a gentle lengthening or release in the stretched tissue. STRETCH  Flexion, Standing  Stand with good posture. With an underhand grip on your  right / left hand, and an overhand grip on the opposite hand, grasp a broomstick or cane so that your hands are a little more than shoulder width apart.  Keeping your right / left elbow straight and shoulder muscles relaxed, push the stick with your opposite hand, to raise your right / left arm in front of your body and then overhead. Raise your arm until you feel a stretch in your right / left shoulder, but before you have increased shoulder pain.  Try to avoid shrugging your right / left shoulder as your arm rises, by keeping your shoulder blade tucked down and toward your mid-back spine. Hold for __________ seconds.  Slowly return to the starting position. Repeat __________ times. Complete this exercise __________ times per day. STRETCH  Abduction, Supine  Lie on your back. With an underhand grip on your right / left hand and an overhand grip on the opposite hand, grasp a broomstick or cane so that your hands are a little more than shoulder width apart.  Keeping your right / left elbow straight and your shoulder muscles relaxed, push the stick with your opposite hand, to raise your right / left arm out to the side of your body and then overhead. Raise your arm until you feel a stretch in your right / left shoulder, but before you have increased shoulder pain.  Try to avoid shrugging your right / left shoulder as your arm rises, by keeping your shoulder blade tucked down and toward your mid-back spine. Hold for __________ seconds.  Slowly return to the starting position. Repeat __________ times. Complete this exercise __________ times per day. ROM  Flexion, Active-Assisted  Lie on your back. You may bend your knees for comfort.  Grasp a broomstick or cane so your hands are about shoulder width apart. Your right / left hand should grip the end of the stick, so that your hand is positioned "thumbs-up," as if you were about to shake hands.  Using your healthy arm to lead, raise your right /  left arm overhead, until you feel a gentle stretch in your shoulder. Hold for __________ seconds.  Use the stick to assist in returning your right / left arm to its starting position. Repeat __________ times. Complete this exercise __________ times per day.  ROM - Internal Rotation, Supine   Lie on your back on a firm surface. Place your right / left elbow about 60 degrees away from your side. Elevate your elbow with a folded towel, so that the elbow and shoulder are the same height.  Using a broomstick or cane and your strong arm, pull your right / left hand toward your body until you feel a gentle stretch, but no increase in your shoulder pain. Keep your shoulder and elbow in place throughout the exercise.  Hold for __________ seconds. Slowly return to the starting position. Repeat __________ times. Complete this exercise __________ times per day. STRETCH - Internal Rotation  Place your right / left hand behind your back, palm up.  Throw a towel or belt over your opposite shoulder. Grasp the towel with your right / left hand.  While keeping an upright posture, gently pull up on the towel, until you feel a stretch in the front of your right / left shoulder.  Avoid shrugging your right / left shoulder as your arm rises, by keeping your shoulder blade tucked down and toward your mid-back spine.  Hold for __________ seconds. Release the stretch, by lowering your healthy hand. Repeat __________ times. Complete this exercise __________ times per day. ROM - Internal Rotation   Using an underhand grip, grasp a stick behind your back with both hands.  While standing upright with good posture, slide the stick up your back until you feel a mild stretch in the front of your shoulder.  Hold for __________ seconds. Slowly return to your starting position. Repeat __________ times. Complete this exercise __________ times per day.  STRETCH  Posterior Shoulder Capsule   Stand or sit with good  posture. Grasp your right / left elbow and draw it across your chest, keeping it at the same height as your shoulder.  Pull your elbow, so your upper arm comes in closer to your chest. Pull until you feel a gentle stretch in the back of your shoulder.  Hold for __________ seconds. Repeat __________ times. Complete this exercise __________ times per day. STRENGTHENING EXERCISES - Impingement Syndrome (Rotator Cuff Tendinitis, Bursitis) These exercises may help you when beginning to rehabilitate your injury. They may resolve your symptoms with or without further involvement from your physician, physical therapist or athletic trainer. While completing these exercises, remember:  Muscles can gain both the endurance and the strength needed for everyday activities through controlled exercises.  Complete these exercises as instructed by your physician, physical therapist or athletic trainer. Increase the resistance and repetitions only as guided.  You may experience muscle soreness or fatigue, but the pain or discomfort you are trying to eliminate should never worsen during these exercises. If this pain does get worse,  stop and make sure you are following the directions exactly. If the pain is still present after adjustments, discontinue the exercise until you can discuss the trouble with your clinician.  During your recovery, avoid activity or exercises which involve actions that place your injured hand or elbow above your head or behind your back or head. These positions stress the tissues which you are trying to heal. STRENGTH - Scapular Depression and Adduction   With good posture, sit on a firm chair. Support your arms in front of you, with pillows, arm rests, or on a table top. Have your elbows in line with the sides of your body.  Gently draw your shoulder blades down and toward your mid-back spine. Gradually increase the tension, without tensing the muscles along the top of your shoulders and  the back of your neck.  Hold for __________ seconds. Slowly release the tension and relax your muscles completely before starting the next repetition.  After you have practiced this exercise, remove the arm support and complete the exercise in standing as well as sitting position. Repeat __________ times. Complete this exercise __________ times per day.  STRENGTH - Shoulder Abductors, Isometric  With good posture, stand or sit about 4-6 inches from a wall, with your right / left side facing the wall.  Bend your right / left elbow. Gently press your right / left elbow into the wall. Increase the pressure gradually, until you are pressing as hard as you can, without shrugging your shoulder or increasing any shoulder discomfort.  Hold for __________ seconds.  Release the tension slowly. Relax your shoulder muscles completely before you begin the next repetition. Repeat __________ times. Complete this exercise __________ times per day.  STRENGTH - External Rotators, Isometric  Keep your right / left elbow at your side and bend it 90 degrees.  Step into a door frame so that the outside of your right / left wrist can press against the door frame without your upper arm leaving your side.  Gently press your right / left wrist into the door frame, as if you were trying to swing the back of your hand away from your stomach. Gradually increase the tension, until you are pressing as hard as you can, without shrugging your shoulder or increasing any shoulder discomfort.  Hold for __________ seconds.  Release the tension slowly. Relax your shoulder muscles completely before you begin the next repetition. Repeat __________ times. Complete this exercise __________ times per day.  STRENGTH - Supraspinatus   Stand or sit with good posture. Grasp a __________ weight, or an exercise band or tubing, so that your hand is "thumbs-up," like you are shaking hands.  Slowly lift your right / left arm in a "V"  away from your thigh, diagonally into the space between your side and straight ahead. Lift your hand to shoulder height or as far as you can, without increasing any shoulder pain. At first, many people do not lift their hands above shoulder height.  Avoid shrugging your right / left shoulder as your arm rises, by keeping your shoulder blade tucked down and toward your mid-back spine.  Hold for __________ seconds. Control the descent of your hand, as you slowly return to your starting position. Repeat __________ times. Complete this exercise __________ times per day.  STRENGTH - External Rotators  Secure a rubber exercise band or tubing to a fixed object (table, pole) so that it is at the same height as your right / left elbow when you  are standing or sitting on a firm surface.  Stand or sit so that the secured exercise band is at your uninjured side.  Bend your right / left elbow 90 degrees. Place a folded towel or small pillow under your right / left arm, so that your elbow is a few inches away from your side.  Keeping the tension on the exercise band, pull it away from your body, as if pivoting on your elbow. Be sure to keep your body steady, so that the movement is coming only from your rotating shoulder.  Hold for __________ seconds. Release the tension in a controlled manner, as you return to the starting position. Repeat __________ times. Complete this exercise __________ times per day.  STRENGTH - Internal Rotators   Secure a rubber exercise band or tubing to a fixed object (table, pole) so that it is at the same height as your right / left elbow when you are standing or sitting on a firm surface.  Stand or sit so that the secured exercise band is at your right / left side.  Bend your elbow 90 degrees. Place a folded towel or small pillow under your right / left arm so that your elbow is a few inches away from your side.  Keeping the tension on the exercise band, pull it across your  body, toward your stomach. Be sure to keep your body steady, so that the movement is coming only from your rotating shoulder.  Hold for __________ seconds. Release the tension in a controlled manner, as you return to the starting position. Repeat __________ times. Complete this exercise __________ times per day.  STRENGTH - Scapular Protractors, Standing   Stand arms length away from a wall. Place your hands on the wall, keeping your elbows straight.  Begin by dropping your shoulder blades down and toward your mid-back spine.  To strengthen your protractors, keep your shoulder blades down, but slide them forward on your rib cage. It will feel as if you are lifting the back of your rib cage away from the wall. This is a subtle motion and can be challenging to complete. Ask your caregiver for further instruction, if you are not sure you are doing the exercise correctly.  Hold for __________ seconds. Slowly return to the starting position, resting the muscles completely before starting the next repetition. Repeat __________ times. Complete this exercise __________ times per day. STRENGTH - Scapular Protractors, Supine  Lie on your back on a firm surface. Extend your right / left arm straight into the air while holding a __________ weight in your hand.  Keeping your head and back in place, lift your shoulder off the floor.  Hold for __________ seconds. Slowly return to the starting position, and allow your muscles to relax completely before starting the next repetition. Repeat __________ times. Complete this exercise __________ times per day. STRENGTH - Scapular Protractors, Quadruped  Get onto your hands and knees, with your shoulders directly over your hands (or as close as you can be, comfortably).  Keeping your elbows locked, lift the back of your rib cage up into your shoulder blades, so your mid-back rounds out. Keep your neck muscles relaxed.  Hold this position for __________ seconds.  Slowly return to the starting position and allow your muscles to relax completely before starting the next repetition. Repeat __________ times. Complete this exercise __________ times per day.  STRENGTH - Scapular Retractors  Secure a rubber exercise band or tubing to a fixed object (table, pole),  so that it is at the height of your shoulders when you are either standing, or sitting on a firm armless chair.  With a palm down grip, grasp an end of the band in each hand. Straighten your elbows and lift your hands straight in front of you, at shoulder height. Step back, away from the secured end of the band, until it becomes tense.  Squeezing your shoulder blades together, draw your elbows back toward your sides, as you bend them. Keep your upper arms lifted away from your body throughout the exercise.  Hold for __________ seconds. Slowly ease the tension on the band, as you reverse the directions and return to the starting position. Repeat __________ times. Complete this exercise __________ times per day. STRENGTH - Shoulder Extensors   Secure a rubber exercise band or tubing to a fixed object (table, pole) so that it is at the height of your shoulders when you are either standing, or sitting on a firm armless chair.  With a thumbs-up grip, grasp an end of the band in each hand. Straighten your elbows and lift your hands straight in front of you, at shoulder height. Step back, away from the secured end of the band, until it becomes tense.  Squeezing your shoulder blades together, pull your hands down to the sides of your thighs. Do not allow your hands to go behind you.  Hold for __________ seconds. Slowly ease the tension on the band, as you reverse the directions and return to the starting position. Repeat __________ times. Complete this exercise __________ times per day.  STRENGTH - Scapular Retractors and External Rotators   Secure a rubber exercise band or tubing to a fixed object (table,  pole) so that it is at the height as your shoulders, when you are either standing, or sitting on a firm armless chair.  With a palm down grip, grasp an end of the band in each hand. Bend your elbows 90 degrees and lift your elbows to shoulder height, at your sides. Step back, away from the secured end of the band, until it becomes tense.  Squeezing your shoulder blades together, rotate your shoulders so that your upper arms and elbows remain stationary, but your fists travel upward to head height.  Hold for __________ seconds. Slowly ease the tension on the band, as you reverse the directions and return to the starting position. Repeat __________ times. Complete this exercise __________ times per day.  STRENGTH - Scapular Retractors and External Rotators, Rowing   Secure a rubber exercise band or tubing to a fixed object (table, pole) so that it is at the height of your shoulders, when you are either standing, or sitting on a firm armless chair.  With a palm down grip, grasp an end of the band in each hand. Straighten your elbows and lift your hands straight in front of you, at shoulder height. Step back, away from the secured end of the band, until it becomes tense.  Step 1: Squeeze your shoulder blades together. Bending your elbows, draw your hands to your chest, as if you are rowing a boat. At the end of this motion, your hands and elbow should be at shoulder height and your elbows should be out to your sides.  Step 2: Rotate your shoulders, to raise your hands above your head. Your forearms should be vertical and your upper arms should be horizontal.  Hold for __________ seconds. Slowly ease the tension on the band, as you reverse the directions and  return to the starting position. Repeat __________ times. Complete this exercise __________ times per day.  STRENGTH  Scapular Depressors  Find a sturdy chair without wheels, such as a dining room chair.  Keeping your feet on the floor, and  your hands on the chair arms, lift your bottom up from the seat, and lock your elbows.  Keeping your elbows straight, allow gravity to pull your body weight down. Your shoulders will rise toward your ears.  Raise your body against gravity by drawing your shoulder blades down your back, shortening the distance between your shoulders and ears. Although your feet should always maintain contact with the floor, your feet should progressively support less body weight, as you get stronger.  Hold for __________ seconds. In a controlled and slow manner, lower your body weight to begin the next repetition. Repeat __________ times. Complete this exercise __________ times per day.  Document Released: 10/07/2005 Document Revised: 12/30/2011 Document Reviewed: 01/19/2009 [facility] Patient Information 2014 [place], [place].

## 2014-02-21 NOTE — Progress Notes (Signed)
Subjective:   This chart was scribed for Regina Honour, MD by Forrestine Him, [facility] Scribe. This patient was seen in room 21 and the patient's care was started 12:00 PM.    Patient ID: Regina Evans, female    DOB: February 03, 1941, 73 y.o.   MRN: [id number]  02/21/2014  Follow-up, Hyperlipidemia, Hypertension, Hypothyroidism and Depression   HPI  HPI Comments: Regina Evans is a 73 y.o. female who presents to [facility] for a 6 month follow up for high blood pressure, high cholesterol, hypothyroidism, anxiety, and depression today.  Last seen 07/2013 for a complete physical examination. No changes were made last visit to blood pressure or cholesterol medications.  Pt states she has not been taking her blood pressure readings regularly.  Pt reports R sided shoulder pain onset 3-4 weeks. She states this pain is exacerbated with rotation and certain movements. She states she attributes this to repetitive painting and gardening. She has tried OTC Aspirin with mild temporary improvement. Denies trying any heat or ice to the area.  Reports ongoing, intermittent cough x 6 months. Pt also reports associated postnasal drip. She has tried her Flonase with some relief. She has also noticed indigestion and reflux symptoms more frequently.  She has been maintained on Lisinopril for years.  She states her bowels are moving well and her appetite is as normal.   At this time she denies any chest pain, SOB, nausea, vomiting, abdominal pain, or palpitations.  Pt is back to walking every day with her husband.   Last Zoster Vaccine: 08/26/13 Last Mammogram: She is due this year to follow up  Review of Systems  Constitutional: Negative for fever, chills, activity change and appetite change.  HENT: Positive for congestion and postnasal drip. Negative for ear pain, rhinorrhea, sinus pressure and sore throat.   Eyes: Negative for redness.  Respiratory: Positive for  cough. Negative for shortness of breath, wheezing and stridor.   Cardiovascular: Negative for chest pain, palpitations and leg swelling.  Gastrointestinal: Negative for nausea, vomiting, abdominal pain, diarrhea and constipation.  Endocrine: Negative for cold intolerance, heat intolerance, polydipsia, polyphagia and polyuria.  Musculoskeletal: Positive for arthralgias (L shoulder) and myalgias. Negative for joint swelling.  Skin: Negative for rash.  Neurological: Negative for dizziness and headaches.  Psychiatric/Behavioral: Negative for confusion and dysphoric mood. The patient is not nervous/anxious.     Past Medical History  Diagnosis Date  . GERD (gastroesophageal reflux disease)   . Arthritis   . Osteoarthritis     hands  . Osteopenia   . Other anxiety states   . Unspecified hypothyroidism   . Headache(784.0)     none since hysterectomy  . Essential hypertension, benign   . Pure hypercholesterolemia   . Unspecified glaucoma   . Depression   . Diverticulosis     no polyps in 2012 repeat colonoscopy in 5 years  . Cataract    Past Surgical History  Procedure Laterality Date  . Tonsillectomy  1949  . Maxillary and mandibular surgery  1991  . Total abdominal hysterectomy  1991    ovaries removed;fibroids  . Tube in right ear  2010  . Tubal ligation      Allergies  Allergen Reactions  . Cefdinir     Rash   Current Outpatient Prescriptions  Medication Sig Dispense Refill  . ALPRAZolam (XANAX) 0.5 MG tablet Take 1 tablet (0.5 mg total) by mouth at bedtime as needed.  30 tablet  3  . aspirin 81 MG tablet Take 81 mg by mouth daily.      . citalopram (CELEXA) 20 MG tablet Take 1 tablet (20 mg total) by mouth daily.  90 tablet  3  . fluticasone (FLONASE) 50 MCG/ACT nasal spray Place 2 sprays into the nose daily.  16 g  6  . levothyroxine (SYNTHROID, LEVOTHROID) 100 MCG tablet Take 1 tablet (100 mcg total) by mouth daily.  90 tablet  3  . lisinopril (PRINIVIL,ZESTRIL) 20 MG  tablet Take 1 tablet (20 mg total) by mouth daily.  90 tablet  3  . lovastatin (MEVACOR) 20 MG tablet Take 1 tablet (20 mg total) by mouth at bedtime.  90 tablet  3  . timolol (BETIMOL) 0.5 % ophthalmic solution Place 1 drop into the left eye daily. USE QHS      . zoster vaccine live, PF, (ZOSTAVAX) 37628 UNT/0.65ML injection Inject 19,400 Units into the skin once.  1 each  0  . cetirizine (ZYRTEC) 10 MG tablet Take 10 mg by mouth daily.      . hydrocortisone (ANUSOL-HC) 25 MG suppository Place 1 suppository (25 mg total) rectally 2 (two) times daily.  24 suppository  2  . meloxicam (MOBIC) 15 MG tablet Take 1 tablet (15 mg total) by mouth daily.  30 tablet  0   No current facility-administered medications for this visit.   History   Social History  . Marital Status: Married    Spouse Name: N/A    Number of Children: 2  . Years of Education: N/A   Occupational History  . retired    Social History Main Topics  . Smoking status: Never Smoker   . Smokeless tobacco: Not on file  . Alcohol Use: Yes     Comment: moderate 5-6 drinks per week (1-2 drinks/wine or liquor each night  . Drug Use: No  . Sexual Activity: Yes    Birth Control/ Protection: None     Comment: number of sex partners in the last 37 months  1   Other Topics Concern  . Not on file   Social History Narrative   Married x 51 years.       Children 2 daughters (65, 44); no grandchildren.      Lives:  With husband.      Employment:  Retired.      Tobacco:       Alcohol:       Caffeine use 3 servings per day.       Exercise: Inactive sporadic walking.       Guns in the home stored in locked cabinet. Always uses seat belts.       Objective:    BP 120/76  Pulse 47  Temp(Src) 98.2 F (36.8 C) (Oral)  Resp 16  Ht 5' 3.75" (1.619 m)  Wt 159 lb (72.122 kg)  BMI 27.52 kg/m2  SpO2 97%  Physical Exam  Nursing note and vitals reviewed. Constitutional: She is oriented to person, place, and time. She appears  well-developed and well-nourished. No distress.  HENT:  Head: Normocephalic and atraumatic.  Right Ear: External ear normal.  Left Ear: External ear normal.  Nose: Nose normal.  Mouth/Throat: Oropharynx is clear and moist. No oropharyngeal exudate.  +drainage OP.  Eyes: Conjunctivae and EOM are normal. Pupils are equal, round, and reactive to light.  Neck: Normal range of motion. Neck supple. Carotid bruit is not present. No thyromegaly present.  Cardiovascular: Normal rate, regular rhythm, normal heart  sounds and intact distal pulses.  Exam reveals no gallop and no friction rub.   No murmur heard. Pulmonary/Chest: Effort normal and breath sounds normal. No respiratory distress. She has no wheezes. She has no rales.  Abdominal: Soft. Bowel sounds are normal. She exhibits no distension and no mass. There is no tenderness. There is no rebound and no guarding.  Musculoskeletal: Normal range of motion. She exhibits tenderness (L shoulder). She exhibits no edema.       Right shoulder: She exhibits tenderness and pain. She exhibits normal range of motion, no bony tenderness, no spasm, normal pulse and normal strength.       Cervical back: Normal. She exhibits normal range of motion, no pain and no spasm.  R SHOULDER: empty can sign negative; cross over negative; motor 5/5; full ROM R shoulder.  Lymphadenopathy:    She has no cervical adenopathy.  Neurological: She is alert and oriented to person, place, and time. She has normal reflexes.  Skin: Skin is warm and dry. No rash noted. She is not diaphoretic. No erythema. No pallor.  Psychiatric: She has a normal mood and affect. Her behavior is normal. Judgment and thought content normal.   Results for orders placed in visit on 02/21/14  CBC WITH DIFFERENTIAL      Result Value Ref Range   WBC 6.7  4.0 - 10.5 K/uL   RBC 4.59  3.87 - 5.11 MIL/uL   Hemoglobin 13.6  12.0 - 15.0 g/dL   HCT 40.1  36.0 - 46.0 %   MCV 87.4  78.0 - 100.0 fL   MCH 29.6   26.0 - 34.0 pg   MCHC 33.9  30.0 - 36.0 g/dL   RDW 14.8  11.5 - 15.5 %   Platelets 262  150 - 400 K/uL   Neutrophils Relative % 54  43 - 77 %   Neutro Abs 3.6  1.7 - 7.7 K/uL   Lymphocytes Relative 38  12 - 46 %   Lymphs Abs 2.5  0.7 - 4.0 K/uL   Monocytes Relative 4  3 - 12 %   Monocytes Absolute 0.3  0.1 - 1.0 K/uL   Eosinophils Relative 3  0 - 5 %   Eosinophils Absolute 0.2  0.0 - 0.7 K/uL   Basophils Relative 1  0 - 1 %   Basophils Absolute 0.1  0.0 - 0.1 K/uL   Smear Review Criteria for review not met    COMPLETE METABOLIC PANEL WITH GFR      Result Value Ref Range   Sodium 140  135 - 145 mEq/L   Potassium 4.7  3.5 - 5.3 mEq/L   Chloride 106  96 - 112 mEq/L   CO2 26  19 - 32 mEq/L   Glucose, Bld 93  70 - 99 mg/dL   BUN 11  6 - 23 mg/dL   Creat 0.91  0.50 - 1.10 mg/dL   Total Bilirubin 0.5  0.2 - 1.2 mg/dL   Alkaline Phosphatase 83  39 - 117 U/L   AST 26  0 - 37 U/L   ALT 20  0 - 35 U/L   Total Protein 7.2  6.0 - 8.3 g/dL   Albumin 4.5  3.5 - 5.2 g/dL   Calcium 9.7  8.4 - 10.5 mg/dL   GFR, Est African American 74     GFR, Est Non African American 64    LIPID PANEL      Result Value Ref Range   Cholesterol  162  0 - 200 mg/dL   Triglycerides 154 (*) <150 mg/dL   HDL 51  >39 mg/dL   Total CHOL/HDL Ratio 3.2     VLDL 31  0 - 40 mg/dL   LDL Cholesterol 80  0 - 99 mg/dL  TSH      Result Value Ref Range   TSH 14.245 (*) 0.350 - 4.500 uIU/mL       Assessment & Plan:  Essential hypertension, benign - Plan: COMPLETE METABOLIC PANEL WITH GFR, TSH  Pure hypercholesterolemia - Plan: Lipid panel  Other abnormal glucose - Plan: CBC with Differential, COMPLETE METABOLIC PANEL WITH GFR  Unspecified hypothyroidism  Depression with anxiety  Pain in joint, shoulder region  1. HTN: controlled with 1/2 Lisinopril 41m daily; obtain labs; continue current medications. 2.  Hypercholesterolemia: controlled; obtain labs; continue current medications. 3.  Hypothyroidism:  uncontrolled at last visit but no changes to management; repeat labs today with worsening TSH; will increase dose of supplementation. 4.  Depression with anxiety: stable despite family stressors; no change to therapy at this time. 5.  Pain Shoulder R:  New.  Consistent with rotator cuff tendonitis: rx for Meloxicam provided to take daily; home exercise program provided to perform daily. If no improvement in one month, to call for ortho referral. Pt declined xray of shoulder today.  If persistent symptoms, will warrant xrya. 6.  Allergic Rhinitis: uncontrolled; recommend Zyrtec daily with Flonase daily. 7.  GERD: New; recommend Prilosec 288mdaily.   8. Cough:  New.  Take Zyrtec, Flonase, Prilosec daily. If no improvement in cough, obtain CXR and stop Lisinopril.    Meds ordered this encounter  Medications  . meloxicam (MOBIC) 15 MG tablet    Sig: Take 1 tablet (15 mg total) by mouth daily.    Dispense:  30 tablet    Refill:  0  . levothyroxine (SYNTHROID, LEVOTHROID) 100 MCG tablet    Sig: Take 1 tablet (100 mcg total) by mouth daily.    Dispense:  90 tablet    Refill:  3    No Follow-up on file.    I personally performed the services described in this documentation, which was scribed in my presence. The recorded information has been reviewed and is accurate.   KrReginia FortsM.D.  [facility]  [phone] [phone]

## 2014-02-22 LAB — TSH: TSH: 14.245 u[IU]/mL — ABNORMAL HIGH (ref 0.350–4.500)

## 2014-02-23 ENCOUNTER — Telehealth: Payer: Self-pay | Admitting: Family Medicine

## 2014-02-23 ENCOUNTER — Encounter: Payer: Self-pay | Admitting: Family Medicine

## 2014-02-23 DIAGNOSIS — K219 Gastro-esophageal reflux disease without esophagitis: Secondary | ICD-10-CM | POA: Insufficient documentation

## 2014-02-23 MED ORDER — LEVOTHYROXINE SODIUM 100 MCG PO TABS: 100.0000 ug | ORAL_TABLET | Freq: Every day | ORAL | Status: DC

## 2014-02-23 NOTE — Telephone Encounter (Signed)
Message copied by Chinita Pester on Wed Feb 23, 2014  1:19 PM ------      Message from: [name]      Created: Wed Feb 23, 2014 10:05 AM       Scheduling ---             I just sent you an email to schedule pt for follow-up visit with me in 2-3 months.  Also, patient was incorrectly scheduled for CPE with Dr. Lorelei Pont in six months; this should be with me in six months for CPE.  Can you correct this error also?            Thanks,      Kristi ------

## 2014-02-23 NOTE — Telephone Encounter (Signed)
Appointment changed to Dr. Tamala Julian for Nov., 2015.

## 2014-04-26 ENCOUNTER — Other Ambulatory Visit: Payer: Self-pay

## 2014-04-26 MED ORDER — LISINOPRIL 10 MG PO TABS: ORAL_TABLET | ORAL | Status: DC

## 2014-04-26 NOTE — Telephone Encounter (Signed)
[facility] faxed req for 10 gm lisinopril bc pt has been breaking 20 mg in half and would rather have 10 mg tabs. Sending Rx per Dr Thompson Caul note in 02/21/14 OV notes.

## 2014-05-03 ENCOUNTER — Ambulatory Visit: Payer: Self-pay | Admitting: Family Medicine

## 2014-05-03 LAB — HM MAMMOGRAPHY: HM MAMMO: NORMAL

## 2014-06-06 ENCOUNTER — Encounter: Payer: Self-pay | Admitting: Family Medicine

## 2014-06-06 ENCOUNTER — Ambulatory Visit (INDEPENDENT_AMBULATORY_CARE_PROVIDER_SITE_OTHER): Payer: Medicare Other | Admitting: Family Medicine

## 2014-06-06 ENCOUNTER — Ambulatory Visit (INDEPENDENT_AMBULATORY_CARE_PROVIDER_SITE_OTHER): Payer: Medicare Other

## 2014-06-06 ENCOUNTER — Ambulatory Visit: Payer: Medicare Other | Admitting: Family Medicine

## 2014-06-06 VITALS — BP 120/80 | HR 57 | Temp 98.2°F | Resp 16 | Ht 63.5 in | Wt 159.0 lb

## 2014-06-06 DIAGNOSIS — M778 Other enthesopathies, not elsewhere classified: Secondary | ICD-10-CM

## 2014-06-06 DIAGNOSIS — M7581 Other shoulder lesions, right shoulder: Principal | ICD-10-CM

## 2014-06-06 DIAGNOSIS — M719 Bursopathy, unspecified: Secondary | ICD-10-CM

## 2014-06-06 DIAGNOSIS — E039 Hypothyroidism, unspecified: Secondary | ICD-10-CM

## 2014-06-06 DIAGNOSIS — R05 Cough: Secondary | ICD-10-CM

## 2014-06-06 DIAGNOSIS — F418 Other specified anxiety disorders: Secondary | ICD-10-CM

## 2014-06-06 DIAGNOSIS — R059 Cough, unspecified: Secondary | ICD-10-CM

## 2014-06-06 DIAGNOSIS — J301 Allergic rhinitis due to pollen: Secondary | ICD-10-CM

## 2014-06-06 DIAGNOSIS — K219 Gastro-esophageal reflux disease without esophagitis: Secondary | ICD-10-CM

## 2014-06-06 DIAGNOSIS — M67919 Unspecified disorder of synovium and tendon, unspecified shoulder: Secondary | ICD-10-CM

## 2014-06-06 DIAGNOSIS — F341 Dysthymic disorder: Secondary | ICD-10-CM

## 2014-06-06 NOTE — Progress Notes (Signed)
Subjective:    Patient ID: Regina Evans, female    DOB: 12-21-1940, 73 y.o.   MRN: [id number]  06/06/2014  Follow-up and Hypothyroidism   HPI HPI Comments:  Regina Evans is a 73 y.o. female who presents to [facility] for a follow-up visit for hyperthyroidism, along with cough, GERD, right shoulder pain, and blood pressure. Pt also reports that her mother passed away this week at the age of 71 y.o.  1. Cough: Zyrtec and Flonase prescribed for severe allergies. Pt reports that she took Zyrtec as prescribed for drainage from allergies. Allergies have improved. Pt reports that she still coughs almost everyday, but she reports she does not have as much congestion.  2. Prilosec 20 mg daily for GERD: If GERD not improved, x-ray, suggested.  Pt reports that she took Prilosec for a while, but has discontinued use. Pt states that GERD still bothers her, but she tries to stay away from onions and wine.  3. Right Shoulder: Pt was prescribed mobic for right shoulder pain, and given a set of exercises to perform. If there is no improvement, an x-ray may be performed. Pt reports that she has not done exercises due to time constraints, but did take the medication for inflammation. Pt reports wanting to try working with a Physiological scientist, but her shoulder has experienced significant improvement. Pt reports that she was able to clean and wash windows while at the beach.  Washing windows definitely worsens pain.  4. Pt reports that she fell at the beach, onto all fours and she believes that she can now move her head better.  5. Thyroid: Pt had an abnormal thyroid level at the last visit and TSH was 14. Supplementation was increased. Pt reports that she has not taken her medication regularly this week.  6. BP: Pt reports that she has not taken her BP regularly, but that her pulses have been good.    Review of Systems  Constitutional: Negative for fever, chills, diaphoresis and fatigue.  HENT: Negative for  congestion.   Eyes: Negative for visual disturbance.  Respiratory: Positive for cough. Negative for shortness of breath.   Cardiovascular: Negative for chest pain, palpitations and leg swelling.  Gastrointestinal: Negative for nausea, vomiting, abdominal pain, diarrhea and constipation.  Endocrine: Negative for cold intolerance, heat intolerance, polydipsia, polyphagia and polyuria.  Musculoskeletal: Positive for arthralgias.       Right shoulder  Neurological: Negative for dizziness, tremors, seizures, syncope, facial asymmetry, speech difficulty, weakness, light-headedness, numbness and headaches.    Past Medical History  Diagnosis Date  . GERD (gastroesophageal reflux disease)   . Arthritis   . Osteoarthritis     hands  . Osteopenia   . Other anxiety states   . Unspecified hypothyroidism   . Headache(784.0)     none since hysterectomy  . Essential hypertension, benign   . Pure hypercholesterolemia   . Unspecified glaucoma   . Depression   . Diverticulosis     no polyps in 2012 repeat colonoscopy in 5 years  . Cataract    Past Surgical History  Procedure Laterality Date  . Tonsillectomy  1949  . Maxillary and mandibular surgery  1991  . Total abdominal hysterectomy  1991    ovaries removed;fibroids  . Tube in right ear  2010  . Tubal ligation     Allergies  Allergen Reactions  . Cefdinir     Rash   Current Outpatient Prescriptions  Medication Sig Dispense Refill  . ALPRAZolam Duanne Moron)  0.5 MG tablet Take 1 tablet (0.5 mg total) by mouth at bedtime as needed.  30 tablet  3  . aspirin 81 MG tablet Take 81 mg by mouth daily.      . cetirizine (ZYRTEC) 10 MG tablet Take 10 mg by mouth daily.      . citalopram (CELEXA) 20 MG tablet Take 1 tablet (20 mg total) by mouth daily.  90 tablet  3  . fluticasone (FLONASE) 50 MCG/ACT nasal spray Place 2 sprays into the nose daily.  16 g  6  . levothyroxine (SYNTHROID, LEVOTHROID) 100 MCG tablet Take 1 tablet (100 mcg total) by  mouth daily.  90 tablet  3  . lisinopril (PRINIVIL,ZESTRIL) 10 MG tablet Take one tablet by mouth daily.  90 tablet  1  . timolol (BETIMOL) 0.5 % ophthalmic solution Place 1 drop into the left eye daily. USE QHS      . lovastatin (MEVACOR) 20 MG tablet TAKE ONE TABLET AT BEDTIME  90 tablet  1   No current facility-administered medications for this visit.   History   Social History  . Marital Status: Married    Spouse Name: N/A    Number of Children: 2  . Years of Education: N/A   Occupational History  . retired    Social History Main Topics  . Smoking status: Never Smoker   . Smokeless tobacco: Not on file  . Alcohol Use: Yes     Comment: moderate 5-6 drinks per week (1-2 drinks/wine or liquor each night  . Drug Use: No  . Sexual Activity: Yes    Birth Control/ Protection: None     Comment: number of sex partners in the last 88 months  1   Other Topics Concern  . Not on file   Social History Narrative   Married x 51 years.       Children 2 daughters (87, 64); no grandchildren.      Lives:  With husband.      Employment:  Retired.      Tobacco:       Alcohol:       Caffeine use 3 servings per day.       Exercise: Inactive sporadic walking.       Guns in the home stored in locked cabinet. Always uses seat belts.       Objective:    BP 120/80  Pulse 57  Temp(Src) 98.2 F (36.8 C) (Oral)  Resp 16  Ht 5' 3.5" (1.613 m)  Wt 159 lb (72.122 kg)  BMI 27.72 kg/m2  SpO2 96% Physical Exam  Nursing note and vitals reviewed. Constitutional: She is oriented to person, place, and time. She appears well-developed and well-nourished. No distress.  HENT:  Head: Normocephalic and atraumatic.  Right Ear: External ear normal.  Left Ear: External ear normal.  Nose: Nose normal.  Mouth/Throat: Oropharynx is clear and moist.  Eyes: Conjunctivae and EOM are normal. Pupils are equal, round, and reactive to light.  Neck: Normal range of motion. Neck supple. Carotid bruit is not  present. No thyromegaly present.  Cardiovascular: Normal rate, regular rhythm, normal heart sounds and intact distal pulses.  Exam reveals no gallop and no friction rub.   No murmur heard. Pulmonary/Chest: Effort normal and breath sounds normal. She has no wheezes. She has no rales.  Abdominal: Soft. Bowel sounds are normal. She exhibits no distension and no mass. There is no tenderness. There is no rebound and no guarding.  Musculoskeletal: Normal  range of motion.       Right shoulder: She exhibits pain. She exhibits normal range of motion, no tenderness, no bony tenderness, no spasm, normal pulse and normal strength.       Cervical back: Normal. She exhibits normal range of motion, no pain and no spasm.  Lymphadenopathy:    She has no cervical adenopathy.  Neurological: She is alert and oriented to person, place, and time. No cranial nerve deficit.  Skin: Skin is warm and dry. No rash noted. She is not diaphoretic. No erythema. No pallor.  Psychiatric: She has a normal mood and affect. Her behavior is normal.   Results for orders placed in visit on 06/06/14  TSH      Result Value Ref Range   TSH 1.695  0.350 - 4.500 uIU/mL  T4, FREE      Result Value Ref Range   Free T4 1.32  0.80 - 1.80 ng/dL   [facility] reading (PRIMARY) by  Dr. Tamala Julian.  CXR: NAD; prominent aortic notch.  R SHOULDER: NAD      Assessment & Plan:   1. Unspecified hypothyroidism   2. Gastroesophageal reflux disease without esophagitis   3. Allergic rhinitis due to pollen   4. Right shoulder tendonitis   5. Cough   6. Depression with anxiety    1. Hypothyroidism: uncontrolled; obtain labs with recent adjustment in medication.  2.  GERD: improved with Prilosec and now only taking PRN.  Continue with dietary modification. Cough did not seem to improve with Prilsoec. 3.  Allergic Rhinitis:  Improved with Zyrtec and Flonase; PND seems less. 4.  R shoulder tendonitis: improving but persistent; pt to start working with a  Physiological scientist and hopes to be able to focus on R shoulder exercises. R shoulder xray negative; overuse injury. 5.  Cough: improved but persistent; consider stopping ACE I in future to see if etiology of cough; pt not interested in stopping ACE-I at this time. 6. Depression with anxiety: stable; continue Celexa at current dose.  Meds ordered this encounter  Medications  . citalopram (CELEXA) 20 MG tablet    Sig: Take 1 tablet (20 mg total) by mouth daily.    Dispense:  90 tablet    Refill:  3    No Follow-up on file.  I personally performed the services described in this documentation, which was scribed in my presence.  The recorded information has been reviewed and is accurate.  Reginia Forts, M.D.  [facility] [address] [place], [place]  [address] [phone] phone [phone] fax

## 2014-06-07 ENCOUNTER — Other Ambulatory Visit: Payer: Self-pay | Admitting: Family Medicine

## 2014-06-07 LAB — T4, FREE: Free T4: 1.32 ng/dL (ref 0.80–1.80)

## 2014-06-07 LAB — TSH: TSH: 1.695 u[IU]/mL (ref 0.350–4.500)

## 2014-06-17 ENCOUNTER — Encounter: Payer: Self-pay | Admitting: Radiology

## 2014-06-19 ENCOUNTER — Encounter: Payer: Self-pay | Admitting: Family Medicine

## 2014-06-19 MED ORDER — CITALOPRAM HYDROBROMIDE 20 MG PO TABS: 20.0000 mg | ORAL_TABLET | Freq: Every day | ORAL | Status: DC

## 2014-07-18 ENCOUNTER — Other Ambulatory Visit: Payer: Self-pay | Admitting: Family Medicine

## 2014-08-22 ENCOUNTER — Encounter: Payer: Medicare Other | Admitting: Family Medicine

## 2014-09-07 ENCOUNTER — Ambulatory Visit (INDEPENDENT_AMBULATORY_CARE_PROVIDER_SITE_OTHER): Payer: Medicare Other | Admitting: Family Medicine

## 2014-09-07 ENCOUNTER — Encounter: Payer: Self-pay | Admitting: Family Medicine

## 2014-09-07 ENCOUNTER — Other Ambulatory Visit: Payer: Self-pay | Admitting: Family Medicine

## 2014-09-07 VITALS — BP 130/70 | HR 82 | Temp 97.4°F | Resp 18 | Ht 64.0 in | Wt 160.0 lb

## 2014-09-07 DIAGNOSIS — E7800 Pure hypercholesterolemia, unspecified: Secondary | ICD-10-CM

## 2014-09-07 DIAGNOSIS — I10 Essential (primary) hypertension: Secondary | ICD-10-CM

## 2014-09-07 DIAGNOSIS — Z23 Encounter for immunization: Secondary | ICD-10-CM

## 2014-09-07 DIAGNOSIS — Z0000 Encounter for general adult medical examination without abnormal findings: Secondary | ICD-10-CM

## 2014-09-07 DIAGNOSIS — F418 Other specified anxiety disorders: Secondary | ICD-10-CM

## 2014-09-07 DIAGNOSIS — E038 Other specified hypothyroidism: Secondary | ICD-10-CM

## 2014-09-07 LAB — COMPLETE METABOLIC PANEL WITH GFR
ALK PHOS: 87 U/L (ref 39–117)
ALT: 18 U/L (ref 0–35)
AST: 19 U/L (ref 0–37)
Albumin: 3.9 g/dL (ref 3.5–5.2)
BUN: 8 mg/dL (ref 6–23)
CALCIUM: 9.6 mg/dL (ref 8.4–10.5)
CHLORIDE: 107 meq/L (ref 96–112)
CO2: 26 mEq/L (ref 19–32)
Creat: 0.83 mg/dL (ref 0.50–1.10)
GFR, Est African American: 82 mL/min
GFR, Est Non African American: 71 mL/min
Glucose, Bld: 90 mg/dL (ref 70–99)
POTASSIUM: 4.7 meq/L (ref 3.5–5.3)
Sodium: 140 mEq/L (ref 135–145)
TOTAL PROTEIN: 6.6 g/dL (ref 6.0–8.3)
Total Bilirubin: 0.3 mg/dL (ref 0.2–1.2)

## 2014-09-07 LAB — CBC WITH DIFFERENTIAL/PLATELET
BASOS ABS: 0 10*3/uL (ref 0.0–0.1)
BASOS PCT: 0 % (ref 0–1)
EOS ABS: 0.2 10*3/uL (ref 0.0–0.7)
EOS PCT: 3 % (ref 0–5)
HCT: 40.2 % (ref 36.0–46.0)
HEMOGLOBIN: 12.9 g/dL (ref 12.0–15.0)
LYMPHS ABS: 2.6 10*3/uL (ref 0.7–4.0)
Lymphocytes Relative: 37 % (ref 12–46)
MCH: 28.7 pg (ref 26.0–34.0)
MCHC: 32.1 g/dL (ref 30.0–36.0)
MCV: 89.3 fL (ref 78.0–100.0)
MPV: 9.5 fL (ref 9.4–12.4)
Monocytes Absolute: 0.4 10*3/uL (ref 0.1–1.0)
Monocytes Relative: 6 % (ref 3–12)
NEUTROS PCT: 54 % (ref 43–77)
Neutro Abs: 3.8 10*3/uL (ref 1.7–7.7)
Platelets: 257 10*3/uL (ref 150–400)
RBC: 4.5 MIL/uL (ref 3.87–5.11)
RDW: 14.1 % (ref 11.5–15.5)
WBC: 7 10*3/uL (ref 4.0–10.5)

## 2014-09-07 LAB — POCT URINALYSIS DIPSTICK
Bilirubin, UA: NEGATIVE
Glucose, UA: NEGATIVE
Ketones, UA: NEGATIVE
Leukocytes, UA: NEGATIVE
NITRITE UA: NEGATIVE
PROTEIN UA: NEGATIVE
SPEC GRAV UA: 1.015
Urobilinogen, UA: 0.2
pH, UA: 5.5

## 2014-09-07 LAB — LIPID PANEL
CHOL/HDL RATIO: 3.5 ratio
CHOLESTEROL: 149 mg/dL (ref 0–200)
HDL: 43 mg/dL (ref >39)
LDL CALC: 76 mg/dL (ref 0–99)
Triglycerides: 148 mg/dL (ref <150)
VLDL: 30 mg/dL (ref 0–40)

## 2014-09-07 NOTE — Patient Instructions (Signed)

## 2014-09-07 NOTE — Progress Notes (Signed)
Subjective:    Patient ID: Regina Evans, female    DOB: October 05, 1941, 73 y.o.   MRN: [id number]  09/07/2014  Annual Exam; Hypothyroidism; Hypertension; Depression; and Hyperlipidemia   HPI This 73 y.o. female presents for Annual Wellness Examination and Complete Physical Examination.  Last physical:  08/11/2013 Pap smear:  2013; hysterectomy status for fibroids; ovaries removed. Mammogram:  05/03/2014 Colonoscopy:  2012  Repeat 5 or 10 years;[facility]. Bone density:  Several years ago.  2012 TDAP:  2012 Pneumovax:  [place] 2012 Zostavax:  2014 Influenza:  05/27/2014 Eye exam:  Twice yearly; glaucoma; early cataracts.  Dingeldein. Dental exam:  Every six months.  R shoulder strain:  Much better.  Raking leaves with minimal pain.    HTN:    Patient reports good compliance with medication, good tolerance to medication, and good symptom control.  Not checking BP at home. Denies CP/palp/SOB/leg swelling.  Hyperlipidemia:Patient reports good compliance with medication, good tolerance to medication, and good symptom control.  Denies HA/dizziness/focal weakness/paresthesias.  Hypothyroidism:  Patient reports good compliance with medication, good tolerance to medication, and good symptom control.  Denies cold intolerance, heat intolerance, tremor, fatigue.  Anxiety and depression:  Patient reports good compliance with medication, good tolerance to medication, and good symptom control.  Denies excessive sadness, crying, isolation, indifference.  Husband won recent election; trying to get everything at home stabilized since election. Both daughters are struggling with health issues.    NO URINARY INCONTINENCE ISSUES.   Review of Systems  Constitutional: Negative.  Negative for fever, chills, diaphoresis, activity change, appetite change, fatigue and unexpected weight change.  HENT: Positive for congestion and postnasal drip. Negative for dental problem, drooling, ear discharge, ear pain, facial  swelling, hearing loss, mouth sores, nosebleeds, rhinorrhea, sinus pressure, sneezing, sore throat, tinnitus, trouble swallowing and voice change.   Eyes: Positive for redness. Negative for photophobia, pain, discharge, itching and visual disturbance.  Respiratory: Negative.  Negative for apnea, cough, choking, chest tightness, shortness of breath, wheezing and stridor.   Cardiovascular: Negative.  Negative for chest pain, palpitations and leg swelling.  Gastrointestinal: Negative.  Negative for nausea, vomiting, abdominal pain, diarrhea, constipation, blood in stool, abdominal distention, anal bleeding and rectal pain.  Endocrine: Negative for cold intolerance, heat intolerance, polydipsia, polyphagia and polyuria.  Genitourinary: Negative.  Negative for dysuria, urgency, frequency, hematuria, flank pain, decreased urine volume, vaginal bleeding, vaginal discharge, enuresis, difficulty urinating, genital sores, vaginal pain, menstrual problem, pelvic pain and dyspareunia.  Musculoskeletal: Positive for arthralgias. Negative for myalgias, back pain, joint swelling, gait problem, neck pain and neck stiffness.  Skin: Negative.  Negative for color change, pallor, rash and wound.  Allergic/Immunologic: Positive for environmental allergies. Negative for food allergies and immunocompromised state.  Neurological: Negative.  Negative for dizziness, tremors, seizures, syncope, facial asymmetry, speech difficulty, weakness, light-headedness, numbness and headaches.  Hematological: Negative.  Negative for adenopathy. Does not bruise/bleed easily.  Psychiatric/Behavioral: Negative.  Negative for suicidal ideas, hallucinations, behavioral problems, confusion, sleep disturbance, self-injury, dysphoric mood, decreased concentration and agitation. The patient is not nervous/anxious and is not hyperactive.     Past Medical History  Diagnosis Date  . GERD (gastroesophageal reflux disease)   . Arthritis   .  Osteoarthritis     hands  . Osteopenia   . Other anxiety states   . Unspecified hypothyroidism   . Headache(784.0)     none since hysterectomy  . Essential hypertension, benign   . Pure hypercholesterolemia   . Unspecified glaucoma   .  Depression   . Diverticulosis     no polyps in 2012 repeat colonoscopy in 5 years  . Cataract    Past Surgical History  Procedure Laterality Date  . Tonsillectomy  1949  . Maxillary and mandibular surgery  1991  . Total abdominal hysterectomy  1991    ovaries removed;fibroids  . Tube in right ear  2010  . Tubal ligation     Allergies  Allergen Reactions  . Cefdinir     Rash   Current Outpatient Prescriptions  Medication Sig Dispense Refill  . ALPRAZolam (XANAX) 0.5 MG tablet Take 1 tablet (0.5 mg total) by mouth at bedtime as needed. 30 tablet 3  . aspirin 81 MG tablet Take 81 mg by mouth daily.    . cetirizine (ZYRTEC) 10 MG tablet Take 10 mg by mouth daily.    . citalopram (CELEXA) 20 MG tablet Take 1 tablet (20 mg total) by mouth daily. 90 tablet 3  . fluticasone (FLONASE) 50 MCG/ACT nasal spray Place 2 sprays into the nose daily. 16 g 6  . levothyroxine (SYNTHROID, LEVOTHROID) 100 MCG tablet Take 1 tablet (100 mcg total) by mouth daily. 90 tablet 3  . lisinopril (PRINIVIL,ZESTRIL) 10 MG tablet TAKE ONE TABLET BY MOUTH EVERY DAY 90 tablet 0  . lovastatin (MEVACOR) 20 MG tablet TAKE ONE TABLET AT BEDTIME 90 tablet 1  . timolol (BETIMOL) 0.5 % ophthalmic solution Place 1 drop into the left eye daily. USE QHS     No current facility-administered medications for this visit.       Objective:    BP 130/70 mmHg  Pulse 82  Temp(Src) 97.4 F (36.3 C)  Resp 18  Ht 5\' 4"  (1.626 m)  Wt 160 lb (72.576 kg)  BMI 27.45 kg/m2  SpO2 98% Physical Exam  Constitutional: She is oriented to person, place, and time. She appears well-developed and well-nourished. No distress.  HENT:  Head: Normocephalic and atraumatic.  Right Ear: External ear  normal.  Left Ear: External ear normal.  Nose: Nose normal.  Mouth/Throat: Oropharynx is clear and moist.  Eyes: Conjunctivae and EOM are normal. Pupils are equal, round, and reactive to light.  Neck: Normal range of motion and full passive range of motion without pain. Neck supple. No JVD present. Carotid bruit is not present. No thyromegaly present.  Cardiovascular: Normal rate, regular rhythm and normal heart sounds.  Exam reveals no gallop and no friction rub.   No murmur heard. Pulmonary/Chest: Effort normal and breath sounds normal. She has no wheezes. She has no rales. Right breast exhibits no inverted nipple, no mass, no nipple discharge, no skin change and no tenderness. Left breast exhibits no inverted nipple, no mass, no nipple discharge, no skin change and no tenderness. Breasts are symmetrical.  Abdominal: Soft. Bowel sounds are normal. She exhibits no distension and no mass. There is no tenderness. There is no rebound and no guarding.  Genitourinary: Vagina normal. There is no rash, tenderness, lesion or injury on the right labia. There is no rash, tenderness, lesion or injury on the left labia.  Musculoskeletal:       Right shoulder: Normal.       Left shoulder: Normal.       Cervical back: Normal.  Lymphadenopathy:    She has no cervical adenopathy.  Neurological: She is alert and oriented to person, place, and time. She has normal reflexes. No cranial nerve deficit. She exhibits normal muscle tone. Coordination normal.  Skin: Skin is warm and  dry. No rash noted. She is not diaphoretic. No erythema. No pallor.  Psychiatric: She has a normal mood and affect. Her behavior is normal. Judgment and thought content normal.  Nursing note and vitals reviewed.  PREVNAR 13 ADMINISTERED.     Assessment & Plan:   1. Routine general medical examination at a health care facility   2. Pure hypercholesterolemia   3. Essential hypertension, benign   4. Other specified hypothyroidism     5. Depression with anxiety   6. Need for vaccination with 13-polyvalent pneumococcal conjugate vaccine      1. Complete Physical Examination with Annual Wellness Examination:  Anticipatory guidance provided --- exercise, weight maintenance.  No longer warrants pap smears due to age and hysterectomy. Mammogram UTD.  Refer for bone density scan next summer with mammogram.  S/p Prevnar 13 in office.  Undergoing treatment for depression. Low fall risk.  Independent with ADLs. Has advanced directives.  No hearing loss.  NO EVIDENCE OF URINARY INCONTINENCE. 2.  Hypercholesterolemia:  Controlled; obtain labs; refills provided. 3. HTN: controlled; obtain labs; refills provided. 4.  Hypothyroidism: controlled; obtain labs; refills provided. 5. Depression with anxiety: controlled; refills provided.   No orders of the defined types were placed in this encounter.    Return in about 6 months (around 03/08/2015) for recheck.    Reginia Forts, M.D.  [facility] [address] [place], [place]  [address] [phone] phone [phone] fax

## 2014-09-12 LAB — T4, FREE: Free T4: 1.41 ng/dL (ref 0.80–1.80)

## 2014-09-12 LAB — TSH: TSH: 0.225 u[IU]/mL — AB (ref 0.350–4.500)

## 2014-09-13 MED ORDER — LISINOPRIL 10 MG PO TABS: 10.0000 mg | ORAL_TABLET | Freq: Every day | ORAL | Status: DC

## 2014-09-13 MED ORDER — LEVOTHYROXINE SODIUM 100 MCG PO TABS: 100.0000 ug | ORAL_TABLET | Freq: Every day | ORAL | Status: DC

## 2014-09-13 MED ORDER — LOVASTATIN 20 MG PO TABS: 20.0000 mg | ORAL_TABLET | Freq: Every day | ORAL | Status: DC

## 2014-09-13 MED ORDER — FLUTICASONE PROPIONATE 50 MCG/ACT NA SUSP: 2.0000 | Freq: Every day | NASAL | Status: DC

## 2014-09-13 MED ORDER — CITALOPRAM HYDROBROMIDE 20 MG PO TABS: 20.0000 mg | ORAL_TABLET | Freq: Every day | ORAL | Status: DC

## 2015-02-15 ENCOUNTER — Other Ambulatory Visit: Payer: Self-pay | Admitting: Unknown Physician Specialty

## 2015-02-15 DIAGNOSIS — R1310 Dysphagia, unspecified: Secondary | ICD-10-CM

## 2015-02-16 ENCOUNTER — Ambulatory Visit
Admit: 2015-02-16 | Disposition: A | Discharge status: Home / Self Care | Payer: Self-pay | Attending: Unknown Physician Specialty | Admitting: Unknown Physician Specialty

## 2015-03-01 ENCOUNTER — Ambulatory Visit (INDEPENDENT_AMBULATORY_CARE_PROVIDER_SITE_OTHER): Payer: Medicare Other | Admitting: Family Medicine

## 2015-03-01 ENCOUNTER — Encounter: Payer: Self-pay | Admitting: Family Medicine

## 2015-03-01 VITALS — BP 110/60 | HR 66 | Temp 98.1°F | Resp 16 | Ht 63.5 in | Wt 158.6 lb

## 2015-03-01 DIAGNOSIS — N760 Acute vaginitis: Secondary | ICD-10-CM

## 2015-03-01 DIAGNOSIS — R300 Dysuria: Secondary | ICD-10-CM | POA: Diagnosis not present

## 2015-03-01 LAB — POCT URINALYSIS DIPSTICK
BILIRUBIN UA: NEGATIVE
Blood, UA: NEGATIVE
GLUCOSE UA: NEGATIVE
KETONES UA: NEGATIVE
NITRITE UA: NEGATIVE
Protein, UA: NEGATIVE
Spec Grav, UA: 1.025
Urobilinogen, UA: 0.2
pH, UA: 5

## 2015-03-01 LAB — POCT WET PREP WITH KOH
KOH PREP POC: NEGATIVE
Trichomonas, UA: NEGATIVE
YEAST WET PREP PER HPF POC: NEGATIVE

## 2015-03-01 LAB — POCT UA - MICROSCOPIC ONLY
Casts, Ur, LPF, POC: NEGATIVE
Crystals, Ur, HPF, POC: NEGATIVE
Mucus, UA: POSITIVE
Yeast, UA: NEGATIVE

## 2015-03-01 MED ORDER — TERCONAZOLE 0.4 % VA CREA: 1.0000 | TOPICAL_CREAM | Freq: Every day | VAGINAL | Status: DC

## 2015-03-01 MED ORDER — FLUCONAZOLE 150 MG PO TABS: 150.0000 mg | ORAL_TABLET | Freq: Once | ORAL | Status: DC

## 2015-03-01 NOTE — Progress Notes (Signed)
Subjective:    Patient ID: Regina Evans, female    DOB: [birth year], 74 y.o.   MRN: [id number]  03/01/2015  vaginal itching   HPI This 74 y.o. female presents for evaluation of vaginal itching onset five days ago.  Vaginal itching externally.  No vaginal discharge.  Had remaining Terazol vaginal suppositories for two days.  Mild dysuria.  Sensitive to water.     Recently treated for sinusitis; [facility]; then ended up with Dr. Ileene Hutchinson office; treated with another abx and prednisone.  Started eating more yogurt and took probiotic.    Had a tick bite on 02/22/15 on R groin region.  No fever/headaches, sore throat.  Review of Systems  Constitutional: Negative for fever, chills, diaphoresis and fatigue.  Genitourinary: Negative for dysuria, frequency, hematuria, flank pain, vaginal bleeding, vaginal discharge, difficulty urinating, genital sores and vaginal pain.    Past Medical History  Diagnosis Date  . GERD (gastroesophageal reflux disease)   . Arthritis   . Osteoarthritis     hands  . Osteopenia   . Other anxiety states   . Unspecified hypothyroidism   . Headache(784.0)     none since hysterectomy  . Essential hypertension, benign   . Pure hypercholesterolemia   . Unspecified glaucoma   . Depression   . Diverticulosis     no polyps in 2012 repeat colonoscopy in 5 years  . Cataract    Past Surgical History  Procedure Laterality Date  . Tonsillectomy  1949  . Maxillary and mandibular surgery  1991  . Total abdominal hysterectomy  1991    ovaries removed;fibroids  . Tube in right ear  2010  . Tubal ligation     Allergies  Allergen Reactions  . Cefdinir     Rash   Current Outpatient Prescriptions  Medication Sig Dispense Refill  . ALPRAZolam (XANAX) 0.5 MG tablet Take 1 tablet (0.5 mg total) by mouth at bedtime as needed. 30 tablet 3  . aspirin 81 MG tablet Take 81 mg by mouth daily.    . citalopram (CELEXA) 20 MG tablet Take 1 tablet  (20 mg total) by mouth daily. 90 tablet 3  . levothyroxine (SYNTHROID, LEVOTHROID) 100 MCG tablet Take 1 tablet (100 mcg total) by mouth daily. 90 tablet 3  . lisinopril (PRINIVIL,ZESTRIL) 10 MG tablet Take 1 tablet (10 mg total) by mouth daily. 90 tablet 3  . lovastatin (MEVACOR) 20 MG tablet Take 1 tablet (20 mg total) by mouth at bedtime. 90 tablet 3  . timolol (BETIMOL) 0.5 % ophthalmic solution Place 1 drop into the left eye daily. USE QHS    . cetirizine (ZYRTEC) 10 MG tablet Take 10 mg by mouth daily.    . fluticasone (FLONASE) 50 MCG/ACT nasal spray Place 2 sprays into both nostrils daily. (Patient not taking: Reported on 03/01/2015) 16 g 11   No current facility-administered medications for this visit.       Objective:    BP 110/60 mmHg  Pulse 66  Temp(Src) 98.1 F (36.7 C) (Oral)  Resp 16  Ht 5' 3.5" (1.613 m)  Wt 158 lb 9.6 oz (71.94 kg)  BMI 27.65 kg/m2  SpO2 97% Physical Exam  Constitutional: She is oriented to person, place, and time. She appears well-developed and well-nourished. No distress.  HENT:  Head: Normocephalic and atraumatic.  Eyes: Conjunctivae are normal. Pupils are equal, round, and reactive to light.  Neck: Normal range of motion. Neck supple.  Cardiovascular: Normal  rate, regular rhythm and normal heart sounds.  Exam reveals no gallop and no friction rub.   No murmur heard. Pulmonary/Chest: Effort normal and breath sounds normal. She has no wheezes. She has no rales.  Genitourinary: Vagina normal. There is no rash, tenderness or lesion on the right labia. There is no rash, tenderness or lesion on the left labia. Right adnexum displays no mass, no tenderness and no fullness. Left adnexum displays no mass, no tenderness and no fullness.  Neurological: She is alert and oriented to person, place, and time.  Skin: She is not diaphoretic.  Psychiatric: She has a normal mood and affect. Her behavior is normal.  Nursing note and vitals reviewed.  Results for  orders placed or performed in visit on 09/07/14  CBC with Differential  Result Value Ref Range   WBC 7.0 4.0 - 10.5 K/uL   RBC 4.50 3.87 - 5.11 MIL/uL   Hemoglobin 12.9 12.0 - 15.0 g/dL   HCT 40.2 36.0 - 46.0 %   MCV 89.3 78.0 - 100.0 fL   MCH 28.7 26.0 - 34.0 pg   MCHC 32.1 30.0 - 36.0 g/dL   RDW 14.1 11.5 - 15.5 %   Platelets 257 150 - 400 K/uL   MPV 9.5 9.4 - 12.4 fL   Neutrophils Relative % 54 43 - 77 %   Neutro Abs 3.8 1.7 - 7.7 K/uL   Lymphocytes Relative 37 12 - 46 %   Lymphs Abs 2.6 0.7 - 4.0 K/uL   Monocytes Relative 6 3 - 12 %   Monocytes Absolute 0.4 0.1 - 1.0 K/uL   Eosinophils Relative 3 0 - 5 %   Eosinophils Absolute 0.2 0.0 - 0.7 K/uL   Basophils Relative 0 0 - 1 %   Basophils Absolute 0.0 0.0 - 0.1 K/uL   Smear Review Criteria for review not met   COMPLETE METABOLIC PANEL WITH GFR  Result Value Ref Range   Sodium 140 135 - 145 mEq/L   Potassium 4.7 3.5 - 5.3 mEq/L   Chloride 107 96 - 112 mEq/L   CO2 26 19 - 32 mEq/L   Glucose, Bld 90 70 - 99 mg/dL   BUN 8 6 - 23 mg/dL   Creat 0.83 0.50 - 1.10 mg/dL   Total Bilirubin 0.3 0.2 - 1.2 mg/dL   Alkaline Phosphatase 87 39 - 117 U/L   AST 19 0 - 37 U/L   ALT 18 0 - 35 U/L   Total Protein 6.6 6.0 - 8.3 g/dL   Albumin 3.9 3.5 - 5.2 g/dL   Calcium 9.6 8.4 - 10.5 mg/dL   GFR, Est African American 82 mL/min   GFR, Est Non African American 71 mL/min  Lipid panel  Result Value Ref Range   Cholesterol 149 0 - 200 mg/dL   Triglycerides 148 <150 mg/dL   HDL 43 >39 mg/dL   Total CHOL/HDL Ratio 3.5 Ratio   VLDL 30 0 - 40 mg/dL   LDL Cholesterol 76 0 - 99 mg/dL  POCT urinalysis dipstick  Result Value Ref Range   Color, UA yellow    Clarity, UA clear    Glucose, UA neg    Bilirubin, UA neg    Ketones, UA neg    Spec Grav, UA 1.015    Blood, UA trace    pH, UA 5.5    Protein, UA neg    Urobilinogen, UA 0.2    Nitrite, UA neg    Leukocytes, UA Negative  Assessment & Plan:   1. Dysuria     No orders  of the defined types were placed in this encounter.    No Follow-up on file.     Keli Buehner Elayne Guerin, M.D. [facility] [address] [place], [place]  [address] [phone] phone [phone] fax

## 2015-03-01 NOTE — Patient Instructions (Signed)

## 2015-03-15 ENCOUNTER — Encounter: Payer: Self-pay | Admitting: Family Medicine

## 2015-03-15 ENCOUNTER — Ambulatory Visit (INDEPENDENT_AMBULATORY_CARE_PROVIDER_SITE_OTHER): Payer: Medicare Other | Admitting: Family Medicine

## 2015-03-15 VITALS — BP 131/79 | HR 63 | Temp 98.3°F | Resp 16 | Ht 63.5 in | Wt 159.0 lb

## 2015-03-15 DIAGNOSIS — J301 Allergic rhinitis due to pollen: Secondary | ICD-10-CM | POA: Diagnosis not present

## 2015-03-15 DIAGNOSIS — I10 Essential (primary) hypertension: Secondary | ICD-10-CM

## 2015-03-15 DIAGNOSIS — K219 Gastro-esophageal reflux disease without esophagitis: Secondary | ICD-10-CM

## 2015-03-15 DIAGNOSIS — F418 Other specified anxiety disorders: Secondary | ICD-10-CM | POA: Diagnosis not present

## 2015-03-15 DIAGNOSIS — E7800 Pure hypercholesterolemia, unspecified: Secondary | ICD-10-CM

## 2015-03-15 DIAGNOSIS — E038 Other specified hypothyroidism: Secondary | ICD-10-CM | POA: Diagnosis not present

## 2015-03-15 LAB — CBC WITH DIFFERENTIAL/PLATELET
BASOS PCT: 1 % (ref 0–1)
Basophils Absolute: 0.1 10*3/uL (ref 0.0–0.1)
Eosinophils Absolute: 0.3 10*3/uL (ref 0.0–0.7)
Eosinophils Relative: 4 % (ref 0–5)
HCT: 39.9 % (ref 36.0–46.0)
Hemoglobin: 13.2 g/dL (ref 12.0–15.0)
Lymphocytes Relative: 43 % (ref 12–46)
Lymphs Abs: 3.1 10*3/uL (ref 0.7–4.0)
MCH: 29.4 pg (ref 26.0–34.0)
MCHC: 33.1 g/dL (ref 30.0–36.0)
MCV: 88.9 fL (ref 78.0–100.0)
MONO ABS: 0.4 10*3/uL (ref 0.1–1.0)
MPV: 9.2 fL (ref 8.6–12.4)
Monocytes Relative: 6 % (ref 3–12)
NEUTROS PCT: 46 % (ref 43–77)
Neutro Abs: 3.3 10*3/uL (ref 1.7–7.7)
PLATELETS: 289 10*3/uL (ref 150–400)
RBC: 4.49 MIL/uL (ref 3.87–5.11)
RDW: 14.7 % (ref 11.5–15.5)
WBC: 7.1 10*3/uL (ref 4.0–10.5)

## 2015-03-15 LAB — COMPREHENSIVE METABOLIC PANEL
ALT: 15 U/L (ref 0–35)
AST: 19 U/L (ref 0–37)
Albumin: 4 g/dL (ref 3.5–5.2)
Alkaline Phosphatase: 93 U/L (ref 39–117)
BUN: 11 mg/dL (ref 6–23)
CHLORIDE: 107 meq/L (ref 96–112)
CO2: 26 mEq/L (ref 19–32)
Calcium: 9.5 mg/dL (ref 8.4–10.5)
Creat: 0.83 mg/dL (ref 0.50–1.10)
GLUCOSE: 97 mg/dL (ref 70–99)
POTASSIUM: 4.5 meq/L (ref 3.5–5.3)
Sodium: 140 mEq/L (ref 135–145)
TOTAL PROTEIN: 7 g/dL (ref 6.0–8.3)
Total Bilirubin: 0.4 mg/dL (ref 0.2–1.2)

## 2015-03-15 LAB — LIPID PANEL
CHOLESTEROL: 168 mg/dL (ref 0–200)
HDL: 47 mg/dL (ref >=46)
LDL Cholesterol: 89 mg/dL (ref 0–99)
Total CHOL/HDL Ratio: 3.6 Ratio
Triglycerides: 162 mg/dL — ABNORMAL HIGH (ref <150)
VLDL: 32 mg/dL (ref 0–40)

## 2015-03-15 LAB — T4, FREE: FREE T4: 1.21 ng/dL (ref 0.80–1.80)

## 2015-03-15 LAB — TSH: TSH: 3.135 u[IU]/mL (ref 0.350–4.500)

## 2015-03-15 NOTE — Progress Notes (Signed)
Subjective:    Patient ID: Regina Evans, female    DOB: 11-03-1940, 74 y.o.   MRN: [id number]  03/15/2015  Hypertension; Hypothyroidism; and Hyperlipidemia   HPI This 74 y.o. female presents for six month follow-up:  1. HTN:  Patient reports good compliance with medication, good tolerance to medication, and good symptom control.    [employer] one day per week.  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  At gym, HR is 136-144.  Not walking; working in yard.  2. Hyperlipidemia:   3.  Hypothyroidism:  4. Anxiety and depression:  Patient reports good compliance with medication, good tolerance to medication, and good symptom control.  Took on chair of constitution.  Not good at emails so pt must do emails.  Sits down with husband 3 days per week to do emails.     Mammogram in 05/2014; [place].    Review of Systems  Constitutional: Negative for fever, chills, diaphoresis and fatigue.  Eyes: Negative for visual disturbance.  Respiratory: Negative for cough and shortness of breath.   Cardiovascular: Negative for chest pain, palpitations and leg swelling.  Gastrointestinal: Negative for nausea, vomiting, abdominal pain, diarrhea and constipation.  Endocrine: Negative for cold intolerance, heat intolerance, polydipsia, polyphagia and polyuria.  Neurological: Negative for dizziness, tremors, seizures, syncope, facial asymmetry, speech difficulty, weakness, light-headedness, numbness and headaches.    Past Medical History  Diagnosis Date  . GERD (gastroesophageal reflux disease)   . Arthritis   . Osteoarthritis     hands  . Osteopenia   . Other anxiety states   . Unspecified hypothyroidism   . Headache(784.0)     none since hysterectomy  . Essential hypertension, benign   . Pure hypercholesterolemia   . Unspecified glaucoma   . Depression   . Diverticulosis     no polyps in 2012 repeat colonoscopy in 5 years  . Cataract    Past Surgical History  Procedure Laterality Date  .  Tonsillectomy  1949  . Maxillary and mandibular surgery  1991  . Total abdominal hysterectomy  1991    ovaries removed;fibroids  . Tube in right ear  2010  . Tubal ligation     Allergies  Allergen Reactions  . Cefdinir     Rash   Current Outpatient Prescriptions  Medication Sig Dispense Refill  . ALPRAZolam (XANAX) 0.5 MG tablet Take 1 tablet (0.5 mg total) by mouth at bedtime as needed. 30 tablet 3  . aspirin 81 MG tablet Take 81 mg by mouth daily.    . cetirizine (ZYRTEC) 10 MG tablet Take 10 mg by mouth daily.    . citalopram (CELEXA) 20 MG tablet Take 1 tablet (20 mg total) by mouth daily. 90 tablet 3  . fluticasone (FLONASE) 50 MCG/ACT nasal spray Place 2 sprays into both nostrils daily. 16 g 11  . levothyroxine (SYNTHROID, LEVOTHROID) 100 MCG tablet Take 1 tablet (100 mcg total) by mouth daily. 90 tablet 3  . lisinopril (PRINIVIL,ZESTRIL) 10 MG tablet Take 1 tablet (10 mg total) by mouth daily. 90 tablet 3  . lovastatin (MEVACOR) 20 MG tablet Take 1 tablet (20 mg total) by mouth at bedtime. 90 tablet 3  . OVER THE COUNTER MEDICATION OTC Preserva Vision 2 capsules daily    . OVER THE COUNTER MEDICATION Milta Deiters Med Sinus Rinse daily    . terconazole (TERAZOL 7) 0.4 % vaginal cream Place 1 applicator vaginally at bedtime. (Patient not taking: Reported on 03/15/2015) 45 g 0  . timolol (BETIMOL) 0.5 %  ophthalmic solution Place 1 drop into the left eye daily. USE QHS     No current facility-administered medications for this visit.       Objective:    BP 131/79 mmHg  Pulse 63  Temp(Src) 98.3 F (36.8 C) (Oral)  Resp 16  Ht 5' 3.5" (1.613 m)  Wt 159 lb (72.122 kg)  BMI 27.72 kg/m2  SpO2 98% Physical Exam  Constitutional: She is oriented to person, place, and time. She appears well-developed and well-nourished. No distress.  HENT:  Head: Normocephalic and atraumatic.  Right Ear: External ear normal.  Left Ear: External ear normal.  Nose: Nose normal.  Mouth/Throat:  Oropharynx is clear and moist.  Eyes: Conjunctivae and EOM are normal. Pupils are equal, round, and reactive to light.  Neck: Normal range of motion. Neck supple. Carotid bruit is not present. No thyromegaly present.  Cardiovascular: Normal rate, regular rhythm, normal heart sounds and intact distal pulses.  Exam reveals no gallop and no friction rub.   No murmur heard. Pulmonary/Chest: Effort normal and breath sounds normal. She has no wheezes. She has no rales.  Abdominal: Soft. Bowel sounds are normal. She exhibits no distension and no mass. There is no tenderness. There is no rebound and no guarding.  Lymphadenopathy:    She has no cervical adenopathy.  Neurological: She is alert and oriented to person, place, and time. No cranial nerve deficit.  Skin: Skin is warm and dry. No rash noted. She is not diaphoretic. No erythema. No pallor.  Psychiatric: She has a normal mood and affect. Her behavior is normal.   Results for orders placed or performed in visit on 03/01/15  POCT urinalysis dipstick  Result Value Ref Range   Color, UA yellow    Clarity, UA clear    Glucose, UA neg    Bilirubin, UA neg    Ketones, UA neg    Spec Grav, UA 1.025    Blood, UA neg    pH, UA 5.0    Protein, UA neg    Urobilinogen, UA 0.2    Nitrite, UA neg    Leukocytes, UA Trace   POCT UA - Microscopic Only  Result Value Ref Range   WBC, Ur, HPF, POC 1-3    RBC, urine, microscopic 2-3    Bacteria, U Microscopic trace    Mucus, UA pos    Epithelial cells, urine per micros 0-1    Crystals, Ur, HPF, POC neg    Casts, Ur, LPF, POC neg    Yeast, UA neg   POCT Wet Prep with KOH  Result Value Ref Range   Trichomonas, UA Negative    Clue Cells Wet Prep HPF POC tntc    Epithelial Wet Prep HPF POC tntc    Yeast Wet Prep HPF POC neg    Bacteria Wet Prep HPF POC 2+    RBC Wet Prep HPF POC 0-2    WBC Wet Prep HPF POC 4-5    KOH Prep POC Negative        Assessment & Plan:   1. Pure hypercholesterolemia    2. Other specified hypothyroidism   3. Gastroesophageal reflux disease without esophagitis   4. Essential hypertension, benign   5. Depression with anxiety   6. Allergic rhinitis due to pollen     No orders of the defined types were placed in this encounter.    No Follow-up on file.     Mariacristina Aday Elayne Guerin, M.D. [facility]  [place], [place]  [address] [phone] phone [phone] fax

## 2015-03-15 NOTE — Patient Instructions (Signed)
DASH Eating Plan °DASH stands for "Dietary Approaches to Stop Hypertension." The DASH eating plan is a healthy eating plan that has been shown to reduce high blood pressure (hypertension). Additional health benefits may include reducing the risk of type 2 diabetes mellitus, heart disease, and stroke. The DASH eating plan may also help with weight loss. °WHAT DO I NEED TO KNOW ABOUT THE DASH EATING PLAN? °For the DASH eating plan, you will follow these general guidelines: °· Choose foods with a percent daily value for sodium of less than 5% (as listed on the food label). °· Use salt-free seasonings or herbs instead of table salt or sea salt. °· Check with your health care provider or pharmacist before using salt substitutes. °· Eat lower-sodium products, often labeled as "lower sodium" or "no salt added." °· Eat fresh foods. °· Eat more vegetables, fruits, and low-fat dairy products. °· Choose whole grains. Look for the word "whole" as the first word in the ingredient list. °· Choose fish and skinless chicken or turkey more often than red meat. Limit fish, poultry, and meat to 6 oz (170 g) each day. °· Limit sweets, desserts, sugars, and sugary drinks. °· Choose heart-healthy fats. °· Limit cheese to 1 oz (28 g) per day. °· Eat more home-cooked food and less restaurant, buffet, and fast food. °· Limit fried foods. °· Cook foods using methods other than frying. °· Limit canned vegetables. If you do use them, rinse them well to decrease the sodium. °· When eating at a restaurant, ask that your food be prepared with less salt, or no salt if possible. °WHAT FOODS CAN I EAT? °Seek help from a dietitian for individual calorie needs. °Grains °Whole grain or whole wheat bread. Brown rice. Whole grain or whole wheat pasta. Quinoa, bulgur, and whole grain cereals. Low-sodium cereals. Corn or whole wheat flour tortillas. Whole grain cornbread. Whole grain crackers. Low-sodium crackers. °Vegetables °Fresh or frozen vegetables  (raw, steamed, roasted, or grilled). Low-sodium or reduced-sodium tomato and vegetable juices. Low-sodium or reduced-sodium tomato sauce and paste. Low-sodium or reduced-sodium canned vegetables.  °Fruits °All fresh, canned (in natural juice), or frozen fruits. °Meat and Other Protein Products °Ground beef (85% or leaner), grass-fed beef, or beef trimmed of fat. Skinless chicken or turkey. Ground chicken or turkey. Pork trimmed of fat. All fish and seafood. Eggs. Dried beans, peas, or lentils. Unsalted nuts and seeds. Unsalted canned beans. °Dairy °Low-fat dairy products, such as skim or 1% milk, 2% or reduced-fat cheeses, low-fat ricotta or cottage cheese, or plain low-fat yogurt. Low-sodium or reduced-sodium cheeses. °Fats and Oils °Tub margarines without trans fats. Light or reduced-fat mayonnaise and salad dressings (reduced sodium). Avocado. Safflower, olive, or canola oils. Natural peanut or almond butter. °Other °Unsalted popcorn and pretzels. °The items listed above may not be a complete list of recommended foods or beverages. Contact your dietitian for more options. °WHAT FOODS ARE NOT RECOMMENDED? °Grains °White bread. White pasta. White rice. Refined cornbread. Bagels and croissants. Crackers that contain trans fat. °Vegetables °Creamed or fried vegetables. Vegetables in a cheese sauce. Regular canned vegetables. Regular canned tomato sauce and paste. Regular tomato and vegetable juices. °Fruits °Dried fruits. Canned fruit in light or heavy syrup. Fruit juice. °Meat and Other Protein Products °Fatty cuts of meat. Ribs, chicken wings, bacon, sausage, bologna, salami, chitterlings, fatback, hot dogs, bratwurst, and packaged luncheon meats. Salted nuts and seeds. Canned beans with salt. °Dairy °Whole or 2% milk, cream, half-and-half, and cream cheese. Whole-fat or sweetened yogurt. Full-fat   cheeses or blue cheese. Nondairy creamers and whipped toppings. Processed cheese, cheese spreads, or cheese  curds. °Condiments °Onion and garlic salt, seasoned salt, table salt, and sea salt. Canned and packaged gravies. Worcestershire sauce. Tartar sauce. Barbecue sauce. Teriyaki sauce. Soy sauce, including reduced sodium. Steak sauce. Fish sauce. Oyster sauce. Cocktail sauce. Horseradish. Ketchup and mustard. Meat flavorings and tenderizers. Bouillon cubes. Hot sauce. Tabasco sauce. Marinades. Taco seasonings. Relishes. °Fats and Oils °Butter, stick margarine, lard, shortening, ghee, and bacon fat. Coconut, palm kernel, or palm oils. Regular salad dressings. °Other °Pickles and olives. Salted popcorn and pretzels. °The items listed above may not be a complete list of foods and beverages to avoid. Contact your dietitian for more information. °WHERE CAN I FIND MORE INFORMATION? °National [employer], [employer], [employer]: www.nhlbi.nih.gov/health/health-topics/topics/dash/ °Document Released: 09/26/2011 Document Revised: 02/21/2014 Document Reviewed: 08/11/2013 °ExitCare® Patient Information ©2015 [employer]. This information is not intended to replace advice given to you by your health care provider. Make sure you discuss any questions you have with your health care provider. ° °

## 2015-08-30 ENCOUNTER — Other Ambulatory Visit: Payer: Self-pay | Admitting: Family Medicine

## 2015-09-20 ENCOUNTER — Encounter: Payer: Self-pay | Admitting: Family Medicine

## 2015-09-22 ENCOUNTER — Encounter: Payer: Self-pay | Admitting: Family Medicine

## 2015-09-22 ENCOUNTER — Ambulatory Visit (INDEPENDENT_AMBULATORY_CARE_PROVIDER_SITE_OTHER): Payer: Medicare Other | Admitting: Family Medicine

## 2015-09-22 VITALS — BP 129/76 | HR 72 | Temp 98.1°F | Resp 16 | Ht 63.5 in | Wt 164.0 lb

## 2015-09-22 DIAGNOSIS — E7800 Pure hypercholesterolemia, unspecified: Secondary | ICD-10-CM

## 2015-09-22 DIAGNOSIS — J301 Allergic rhinitis due to pollen: Secondary | ICD-10-CM

## 2015-09-22 DIAGNOSIS — I10 Essential (primary) hypertension: Secondary | ICD-10-CM

## 2015-09-22 DIAGNOSIS — F418 Other specified anxiety disorders: Secondary | ICD-10-CM | POA: Diagnosis not present

## 2015-09-22 DIAGNOSIS — R3129 Other microscopic hematuria: Secondary | ICD-10-CM

## 2015-09-22 DIAGNOSIS — E038 Other specified hypothyroidism: Secondary | ICD-10-CM | POA: Diagnosis not present

## 2015-09-22 DIAGNOSIS — E034 Atrophy of thyroid (acquired): Secondary | ICD-10-CM

## 2015-09-22 DIAGNOSIS — K219 Gastro-esophageal reflux disease without esophagitis: Secondary | ICD-10-CM | POA: Diagnosis not present

## 2015-09-22 DIAGNOSIS — E2839 Other primary ovarian failure: Secondary | ICD-10-CM

## 2015-09-22 DIAGNOSIS — Z0000 Encounter for general adult medical examination without abnormal findings: Secondary | ICD-10-CM | POA: Diagnosis not present

## 2015-09-22 DIAGNOSIS — R7302 Impaired glucose tolerance (oral): Secondary | ICD-10-CM

## 2015-09-22 LAB — COMPREHENSIVE METABOLIC PANEL
ALBUMIN: 4.5 g/dL (ref 3.6–5.1)
ALK PHOS: 83 U/L (ref 33–130)
ALT: 18 U/L (ref 6–29)
AST: 22 U/L (ref 10–35)
BILIRUBIN TOTAL: 0.5 mg/dL (ref 0.2–1.2)
BUN: 12 mg/dL (ref 7–25)
CALCIUM: 9.6 mg/dL (ref 8.6–10.4)
CO2: 23 mmol/L (ref 20–31)
Chloride: 104 mmol/L (ref 98–110)
Creat: 0.84 mg/dL (ref 0.60–0.93)
Glucose, Bld: 81 mg/dL (ref 65–99)
Potassium: 4.3 mmol/L (ref 3.5–5.3)
Sodium: 137 mmol/L (ref 135–146)
Total Protein: 7.1 g/dL (ref 6.1–8.1)

## 2015-09-22 LAB — POCT URINALYSIS DIP (MANUAL ENTRY)
BILIRUBIN UA: NEGATIVE
Glucose, UA: NEGATIVE
Ketones, POC UA: NEGATIVE
LEUKOCYTES UA: NEGATIVE
Nitrite, UA: NEGATIVE
PH UA: 5
Protein Ur, POC: NEGATIVE
Spec Grav, UA: 1.025
UROBILINOGEN UA: 0.2

## 2015-09-22 LAB — POC MICROSCOPIC URINALYSIS (UMFC): MUCUS RE: ABSENT

## 2015-09-22 LAB — CBC WITH DIFFERENTIAL/PLATELET
BASOS ABS: 0.1 10*3/uL (ref 0.0–0.1)
Basophils Relative: 1 % (ref 0–1)
Eosinophils Absolute: 0.2 10*3/uL (ref 0.0–0.7)
Eosinophils Relative: 3 % (ref 0–5)
HEMATOCRIT: 41.9 % (ref 36.0–46.0)
HEMOGLOBIN: 13.8 g/dL (ref 12.0–15.0)
LYMPHS ABS: 2.9 10*3/uL (ref 0.7–4.0)
LYMPHS PCT: 35 % (ref 12–46)
MCH: 29.7 pg (ref 26.0–34.0)
MCHC: 32.9 g/dL (ref 30.0–36.0)
MCV: 90.3 fL (ref 78.0–100.0)
MONO ABS: 0.4 10*3/uL (ref 0.1–1.0)
MPV: 9.6 fL (ref 8.6–12.4)
Monocytes Relative: 5 % (ref 3–12)
NEUTROS ABS: 4.6 10*3/uL (ref 1.7–7.7)
Neutrophils Relative %: 56 % (ref 43–77)
Platelets: 284 10*3/uL (ref 150–400)
RBC: 4.64 MIL/uL (ref 3.87–5.11)
RDW: 14.5 % (ref 11.5–15.5)
WBC: 8.2 10*3/uL (ref 4.0–10.5)

## 2015-09-22 LAB — LIPID PANEL
CHOLESTEROL: 174 mg/dL (ref 125–200)
HDL: 47 mg/dL (ref >=46)
LDL Cholesterol: 99 mg/dL (ref <130)
Total CHOL/HDL Ratio: 3.7 Ratio (ref <=5.0)
Triglycerides: 142 mg/dL (ref <150)
VLDL: 28 mg/dL (ref <30)

## 2015-09-22 LAB — TSH: TSH: 23.844 u[IU]/mL — ABNORMAL HIGH (ref 0.350–4.500)

## 2015-09-22 LAB — T4, FREE: FREE T4: 0.96 ng/dL (ref 0.80–1.80)

## 2015-09-22 MED ORDER — LOSARTAN POTASSIUM 50 MG PO TABS: 50.0000 mg | ORAL_TABLET | Freq: Every day | ORAL | Status: DC

## 2015-09-22 NOTE — Patient Instructions (Addendum)
1.  STOP LISINOPRIL (TO SEE IF COUGH WILL RESOLVE). 2.  START LOSARTAN 50MG  ONE DAILY FOR BLOOD PRESSURE.  Keeping You Healthy  Get These Tests  Blood Pressure- Have your blood pressure checked by your healthcare provider at least once a year.  Normal blood pressure is 120/80.  Weight- Have your body mass index (BMI) calculated to screen for obesity.  BMI is a measure of body fat based on height and weight.  You can calculate your own BMI at [employer].it  Cholesterol- Have your cholesterol checked every year.  Diabetes- Have your blood sugar checked every year if you have high blood pressure, high cholesterol, a family history of diabetes or if you are overweight.  Pap Test - Have a pap test every 1 to 5 years if you have been sexually active.  If you are older than 65 and recent pap tests have been normal you may not need additional pap tests.  In addition, if you have had a hysterectomy  for benign disease additional pap tests are not necessary.  Mammogram-Yearly mammograms are essential for early detection of breast cancer  Screening for Colon Cancer- Colonoscopy starting at age 4. Screening may begin sooner depending on your family history and other health conditions.  Follow up colonoscopy as directed by your Gastroenterologist.  Screening for Osteoporosis- Screening begins at age 8 with bone density scanning, sooner if you are at higher risk for developing Osteoporosis.  Get these medicines  Calcium with Vitamin D- Your body requires 1200-1500 mg of Calcium a day and [phone] IU of Vitamin D a day.  You can only absorb 500 mg of Calcium at a time therefore Calcium must be taken in 2 or 3 separate doses throughout the day.  Hormones- Hormone therapy has been associated with increased risk for certain cancers and heart disease.  Talk to your healthcare provider about if you need relief from menopausal symptoms.  Aspirin- Ask your healthcare provider about taking  Aspirin to prevent Heart Disease and Stroke.  Get these Immuniztions  Flu shot- Every fall  Pneumonia shot- Once after the age of 47; if you are younger ask your healthcare provider if you need a pneumonia shot.  Tetanus- Every ten years.  Zostavax- Once after the age of 3 to prevent shingles.  Take these steps  Don't smoke- Your healthcare provider can help you quit. For tips on how to quit, ask your healthcare provider or go to www.smokefree.gov or call 1-800 QUIT-NOW.  Be physically active- Exercise 5 days a week for a minimum of 30 minutes.  If you are not already physically active, start slow and gradually work up to 30 minutes of moderate physical activity.  Try walking, dancing, bike riding, swimming, etc.  Eat a healthy diet- Eat a variety of healthy foods such as fruits, vegetables, whole grains, low fat milk, low fat cheeses, yogurt, lean meats, chicken, fish, eggs, dried beans, tofu, etc.  For more information go to www.thenutritionsource.org  Dental visit- Brush and floss teeth twice daily; visit your dentist twice a year.  Eye exam- Visit your Optometrist or Ophthalmologist yearly.  Drink alcohol in moderation- Limit alcohol intake to one drink or less a day.  Never drink and drive.  Depression- Your emotional health is as important as your physical health.  If you're feeling down or losing interest in things you normally enjoy, please talk to your healthcare provider.  Seat Belts- can save your life; always wear one  Smoke/Carbon Monoxide detectors- These detectors need  to be installed on the appropriate level of your home.  Replace batteries at least once a year.  Violence- If anyone is threatening or hurting you, please tell your healthcare provider.  Living Will/ Health care power of attorney- Discuss with your healthcare provider and family.

## 2015-09-22 NOTE — Progress Notes (Signed)
Subjective:    Patient ID: Regina Evans, female    DOB: 1940/11/02, 74 y.o.   MRN: [id number]  09/22/2015  Annual Exam   HPI This 74 y.o. female presents for Annual Wellness Examination and for Complete Physical Examination and for chronic medical follow-up:  Last physical:  09-07-2014 Pap smear:  hysterectomy Mammogram:  05-03-2014;  Colonoscopy: 2012 Bone density:  2012; osteopenia TDAP: 2012 Pneumovax:  2012, Prevnar 13  2015 Zostavax:  2014 Influenza:  2016 Eye exam:  Every six months; +glasses.  Glaucoma. Dental exam:  Every six months.  HTN: Patient reports good compliance with medication, good tolerance to medication, and good symptom control.  Not checking blood pressure.  Hyperlipidemia: Patient reports good compliance with medication, good tolerance to medication, and good symptom control.    Hypothyroidism: Patient reports good compliance with medication, good tolerance to medication, and good symptom control.    Anxiety and depression: Patient reports good compliance with medication, good tolerance to medication, and good symptom control.  Not taken in months.  Will have insomnia.  Taking 1/2 Citalopram 20mg  dail.  Basal cell carcinoma: L lower leg and lower back; Brendolyn Patty; needs further resection of lower back lesion.    Cough: s/p CXR WNL.  Daily cough.  Mostly at night.  +PND; barium swallow.  S/p [facility] consultation.     Review of Systems  Constitutional: Negative for fever, chills, diaphoresis, activity change, appetite change, fatigue and unexpected weight change.  HENT: Negative for congestion, dental problem, drooling, ear discharge, ear pain, facial swelling, hearing loss, mouth sores, nosebleeds, postnasal drip, rhinorrhea, sinus pressure, sneezing, sore throat, tinnitus, trouble swallowing and voice change.   Eyes: Negative for photophobia, pain, discharge, redness, itching and visual disturbance.  Respiratory: Negative for apnea, cough, choking,  chest tightness, shortness of breath, wheezing and stridor.   Cardiovascular: Negative for chest pain, palpitations and leg swelling.  Gastrointestinal: Negative for nausea, vomiting, abdominal pain, diarrhea, constipation, blood in stool, abdominal distention, anal bleeding and rectal pain.  Endocrine: Negative for cold intolerance, heat intolerance, polydipsia, polyphagia and polyuria.  Genitourinary: Negative for dysuria, urgency, frequency, hematuria, flank pain, decreased urine volume, vaginal bleeding, vaginal discharge, enuresis, difficulty urinating, genital sores, vaginal pain, menstrual problem, pelvic pain and dyspareunia.  Musculoskeletal: Negative for myalgias, back pain, joint swelling, arthralgias, gait problem, neck pain and neck stiffness.  Skin: Negative for color change, pallor, rash and wound.  Allergic/Immunologic: Negative for environmental allergies, food allergies and immunocompromised state.  Neurological: Negative for dizziness, tremors, seizures, syncope, facial asymmetry, speech difficulty, weakness, light-headedness, numbness and headaches.  Hematological: Negative for adenopathy. Does not bruise/bleed easily.  Psychiatric/Behavioral: Negative for suicidal ideas, hallucinations, behavioral problems, confusion, sleep disturbance, self-injury, dysphoric mood, decreased concentration and agitation. The patient is not nervous/anxious and is not hyperactive.     Past Medical History  Diagnosis Date  . GERD (gastroesophageal reflux disease)   . Arthritis   . Osteoarthritis     hands  . Osteopenia   . Other anxiety states   . Unspecified hypothyroidism   . Headache(784.0)     none since hysterectomy  . Essential hypertension, benign   . Pure hypercholesterolemia   . Unspecified glaucoma   . Depression   . Diverticulosis     no polyps in 2012 repeat colonoscopy in 5 years  . Cataract    Past Surgical History  Procedure Laterality Date  . Tonsillectomy  1949  .  Maxillary and mandibular surgery  1991  . Total  abdominal hysterectomy  1991    ovaries removed;fibroids  . Tube in right ear  2010  . Tubal ligation     Allergies  Allergen Reactions  . Cefdinir     Rash   Current Outpatient Prescriptions  Medication Sig Dispense Refill  . ALPRAZolam (XANAX) 0.5 MG tablet Take 1 tablet (0.5 mg total) by mouth at bedtime as needed. 30 tablet 3  . aspirin 81 MG tablet Take 81 mg by mouth daily.    . cetirizine (ZYRTEC) 10 MG tablet Take 10 mg by mouth daily.    . citalopram (CELEXA) 20 MG tablet Take 1 tablet (20 mg total) by mouth daily. 90 tablet 3  . citalopram (CELEXA) 20 MG tablet Take 1 tablet (20 mg total) by mouth daily. PATIENT NEEDS AN OFFICE VISIT FOR ADDITIONAL REFILLS. 30 tablet 0  . fluocinonide cream (LIDEX) [id number] % Apply 1 application topically 2 (two) times daily.    Marland Kitchen levothyroxine (SYNTHROID, LEVOTHROID) 100 MCG tablet Take 1 tablet (100 mcg total) by mouth daily. 90 tablet 3  . lisinopril (PRINIVIL,ZESTRIL) 10 MG tablet Take 1 tablet (10 mg total) by mouth daily. 90 tablet 3  . lovastatin (MEVACOR) 20 MG tablet Take 1 tablet (20 mg total) by mouth at bedtime. 90 tablet 3  . timolol (BETIMOL) 0.5 % ophthalmic solution Place 1 drop into the left eye daily. USE QHS    . fluticasone (FLONASE) 50 MCG/ACT nasal spray Place 2 sprays into both nostrils daily. (Patient not taking: Reported on 09/22/2015) 16 g 11  . losartan (COZAAR) 50 MG tablet Take 1 tablet (50 mg total) by mouth daily. 90 tablet 1   No current facility-administered medications for this visit.   Social History   Social History  . Marital Status: Married    Spouse Name: N/A  . Number of Children: 2  . Years of Education: N/A   Occupational History  . retired    Social History Main Topics  . Smoking status: Never Smoker   . Smokeless tobacco: Not on file  . Alcohol Use: Yes     Comment: moderate 5-6 drinks per week (1-2 drinks/wine or liquor each night  . Drug Use:  No  . Sexual Activity: Yes    Birth Control/ Protection: None     Comment: number of sex partners in the last 56 months  1   Other Topics Concern  . Not on file   Social History Narrative   Married x 52 years.       Children 2 daughters (47, 74); no grandchildren.      Lives:  With husband.      Employment:  Retired.      Tobacco:  none      Alcohol:  Wine or mixed drink; 1-3 drinks five days per week.     Caffeine use 3 servings per day.       Exercise: Inactive sporadic walking.       Guns in the home stored in locked cabinet. Always uses seat belts.      ADLs; independent with ADLs; no assistant devices.      Advanced Directives: yes; FULL CODE but no prolonged measures.    Family History  Problem Relation Age of Onset  . Hypertension Mother   . Alzheimer's disease Mother     Hospice care  . Macular degeneration Mother   . Colon polyps Mother   . Cancer Father     prostate  . Diabetes Brother   .  Alzheimer's disease Maternal Grandmother   . Cancer Paternal Grandmother   . Dementia Paternal Grandfather   . Cirrhosis Paternal Grandfather        Objective:    BP 129/76 mmHg  Pulse 72  Temp(Src) 98.1 F (36.7 C) (Oral)  Resp 16  Ht 5' 3.5" (1.613 m)  Wt 164 lb (74.39 kg)  BMI 28.59 kg/m2 Physical Exam  Constitutional: She is oriented to person, place, and time. She appears well-developed and well-nourished. No distress.  HENT:  Head: Normocephalic and atraumatic.  Right Ear: External ear normal.  Left Ear: External ear normal.  Nose: Nose normal.  Mouth/Throat: Oropharynx is clear and moist.  Eyes: Conjunctivae and EOM are normal. Pupils are equal, round, and reactive to light.  Neck: Normal range of motion and full passive range of motion without pain. Neck supple. No JVD present. Carotid bruit is not present. No thyromegaly present.  Cardiovascular: Normal rate, regular rhythm and normal heart sounds.  Exam reveals no gallop and no friction rub.   No murmur  heard. Pulmonary/Chest: Effort normal and breath sounds normal. She has no wheezes. She has no rales.  Abdominal: Soft. Bowel sounds are normal. She exhibits no distension and no mass. There is no tenderness. There is no rebound and no guarding.  Musculoskeletal:       Right shoulder: Normal.       Left shoulder: Normal.       Cervical back: Normal.  Lymphadenopathy:    She has no cervical adenopathy.  Neurological: She is alert and oriented to person, place, and time. She has normal reflexes. No cranial nerve deficit. She exhibits normal muscle tone. Coordination normal.  Skin: Skin is warm and dry. No rash noted. She is not diaphoretic. No erythema. No pallor.  Psychiatric: She has a normal mood and affect. Her behavior is normal. Judgment and thought content normal.  Nursing note and vitals reviewed.  Results for orders placed or performed in visit on 09/22/15  POCT urinalysis dipstick  Result Value Ref Range   Color, UA yellow yellow   Clarity, UA clear clear   Glucose, UA negative negative   Bilirubin, UA negative negative   Ketones, POC UA negative negative   Spec Grav, UA 1.025    Blood, UA small (A) negative   pH, UA 5.0    Protein Ur, POC negative negative   Urobilinogen, UA 0.2    Nitrite, UA Negative Negative   Leukocytes, UA Negative Negative       Assessment & Plan:   1. Encounter for Medicare annual wellness exam   2. Routine physical examination   3. Pure hypercholesterolemia   4. Essential hypertension, benign   5. Depression with anxiety   6. Allergic rhinitis due to pollen   7. Gastroesophageal reflux disease without esophagitis   8. Hypothyroidism due to acquired atrophy of thyroid   9. Glucose intolerance (impaired glucose tolerance)   10. Estrogen deficiency   11. Hematuria, microscopic     Orders Placed This Encounter  Procedures  . DG Bone Density    Standing Status: Future     Number of Occurrences:      Standing Expiration Date: 11/22/2016     Order Specific Question:  Reason for Exam (SYMPTOM  OR DIAGNOSIS REQUIRED)    Answer:  ESTROGEN DEFICIENCY    Order Specific Question:  Preferred imaging location?    Answer:  [facility]  . CBC with Differential/Platelet  . Comprehensive metabolic panel  Order Specific Question:  Has the patient fasted?    Answer:  Yes  . Hemoglobin A1c  . Lipid panel    Order Specific Question:  Has the patient fasted?    Answer:  Yes  . TSH  . T4, free  . POCT urinalysis dipstick  . POCT Microscopic Urinalysis ([facility])   Meds ordered this encounter  Medications  . fluocinonide cream (LIDEX) 0.05 %    Sig: Apply 1 application topically 2 (two) times daily.  Marland Kitchen losartan (COZAAR) 50 MG tablet    Sig: Take 1 tablet (50 mg total) by mouth daily.    Dispense:  90 tablet    Refill:  1    Return in about 6 months (around 03/22/2016) for recheck high blood pressure, high cholesterol, thyroid.    Conny Situ Elayne Guerin, M.D. [facility] [address] [place], [place]  [phone] phone [phone] fax

## 2015-09-23 LAB — HEMOGLOBIN A1C
HEMOGLOBIN A1C: 5.9 % — AB (ref <5.7)
MEAN PLASMA GLUCOSE: 123 mg/dL — AB (ref <117)

## 2015-09-24 MED ORDER — LEVOTHYROXINE SODIUM 100 MCG PO TABS: 100.0000 ug | ORAL_TABLET | Freq: Every day | ORAL | Status: DC

## 2015-09-24 MED ORDER — CITALOPRAM HYDROBROMIDE 20 MG PO TABS: 20.0000 mg | ORAL_TABLET | Freq: Every day | ORAL | Status: DC

## 2015-09-24 MED ORDER — LOVASTATIN 20 MG PO TABS: 20.0000 mg | ORAL_TABLET | Freq: Every day | ORAL | Status: DC

## 2015-09-25 ENCOUNTER — Other Ambulatory Visit: Payer: Self-pay | Admitting: Family Medicine

## 2015-09-25 DIAGNOSIS — Z1231 Encounter for screening mammogram for malignant neoplasm of breast: Secondary | ICD-10-CM

## 2015-10-26 ENCOUNTER — Ambulatory Visit
Admission: RE | Admit: 2015-10-26 | Discharge: 2015-10-26 | Disposition: A | Discharge status: Home / Self Care | Payer: Medicare Other | Source: Ambulatory Visit | Attending: Family Medicine | Admitting: Family Medicine

## 2015-10-26 ENCOUNTER — Ambulatory Visit: Payer: Medicare Other

## 2015-10-26 ENCOUNTER — Other Ambulatory Visit: Payer: Self-pay | Admitting: Family Medicine

## 2015-10-26 DIAGNOSIS — Z1231 Encounter for screening mammogram for malignant neoplasm of breast: Secondary | ICD-10-CM | POA: Diagnosis not present

## 2015-11-05 ENCOUNTER — Encounter: Payer: Self-pay | Admitting: Family Medicine

## 2015-11-21 ENCOUNTER — Encounter: Payer: Self-pay | Admitting: Family Medicine

## 2015-11-24 MED ORDER — LOSARTAN POTASSIUM 100 MG PO TABS: 100.0000 mg | ORAL_TABLET | Freq: Every day | ORAL | Status: DC

## 2015-12-04 ENCOUNTER — Other Ambulatory Visit: Payer: Self-pay | Admitting: Family Medicine

## 2015-12-07 NOTE — Telephone Encounter (Signed)
Called in.

## 2015-12-07 NOTE — Telephone Encounter (Signed)
Please call in or fax refill of Xanax as approved. 

## 2016-02-27 ENCOUNTER — Other Ambulatory Visit: Payer: Self-pay | Admitting: Family Medicine

## 2016-04-09 ENCOUNTER — Ambulatory Visit (INDEPENDENT_AMBULATORY_CARE_PROVIDER_SITE_OTHER): Payer: Medicare Other | Admitting: Family Medicine

## 2016-04-09 ENCOUNTER — Encounter: Payer: Self-pay | Admitting: Family Medicine

## 2016-04-09 VITALS — BP 123/75 | HR 56 | Temp 97.4°F | Resp 16 | Ht 64.0 in | Wt 166.4 lb

## 2016-04-09 DIAGNOSIS — M6530 Trigger finger, unspecified finger: Secondary | ICD-10-CM | POA: Diagnosis not present

## 2016-04-09 DIAGNOSIS — F418 Other specified anxiety disorders: Secondary | ICD-10-CM

## 2016-04-09 DIAGNOSIS — K219 Gastro-esophageal reflux disease without esophagitis: Secondary | ICD-10-CM | POA: Diagnosis not present

## 2016-04-09 DIAGNOSIS — E7800 Pure hypercholesterolemia, unspecified: Secondary | ICD-10-CM

## 2016-04-09 DIAGNOSIS — E034 Atrophy of thyroid (acquired): Secondary | ICD-10-CM | POA: Diagnosis not present

## 2016-04-09 DIAGNOSIS — I10 Essential (primary) hypertension: Secondary | ICD-10-CM | POA: Diagnosis not present

## 2016-04-09 DIAGNOSIS — E038 Other specified hypothyroidism: Secondary | ICD-10-CM | POA: Diagnosis not present

## 2016-04-09 DIAGNOSIS — J301 Allergic rhinitis due to pollen: Secondary | ICD-10-CM | POA: Diagnosis not present

## 2016-04-09 LAB — COMPREHENSIVE METABOLIC PANEL
ALT: 19 U/L (ref 6–29)
AST: 21 U/L (ref 10–35)
Albumin: 4.3 g/dL (ref 3.6–5.1)
Alkaline Phosphatase: 91 U/L (ref 33–130)
BUN: 11 mg/dL (ref 7–25)
CHLORIDE: 106 mmol/L (ref 98–110)
CO2: 25 mmol/L (ref 20–31)
CREATININE: 0.94 mg/dL — AB (ref 0.60–0.93)
Calcium: 9.4 mg/dL (ref 8.6–10.4)
GLUCOSE: 81 mg/dL (ref 65–99)
POTASSIUM: 4.4 mmol/L (ref 3.5–5.3)
SODIUM: 138 mmol/L (ref 135–146)
TOTAL PROTEIN: 7 g/dL (ref 6.1–8.1)
Total Bilirubin: 0.5 mg/dL (ref 0.2–1.2)

## 2016-04-09 LAB — LIPID PANEL
CHOL/HDL RATIO: 3.6 ratio (ref <=5.0)
CHOLESTEROL: 169 mg/dL (ref 125–200)
HDL: 47 mg/dL (ref >=46)
LDL CALC: 85 mg/dL (ref <130)
Triglycerides: 187 mg/dL — ABNORMAL HIGH (ref <150)
VLDL: 37 mg/dL — AB (ref <30)

## 2016-04-09 LAB — CBC WITH DIFFERENTIAL/PLATELET
BASOS PCT: 1 %
Basophils Absolute: 83 cells/uL (ref 0–200)
EOS ABS: 332 {cells}/uL (ref 15–500)
Eosinophils Relative: 4 %
HEMATOCRIT: 40.8 % (ref 35.0–45.0)
HEMOGLOBIN: 13.6 g/dL (ref 11.7–15.5)
LYMPHS ABS: 2905 {cells}/uL (ref 850–3900)
LYMPHS PCT: 35 %
MCH: 29.6 pg (ref 27.0–33.0)
MCHC: 33.3 g/dL (ref 32.0–36.0)
MCV: 88.9 fL (ref 80.0–100.0)
MONO ABS: 415 {cells}/uL (ref 200–950)
MPV: 9.6 fL (ref 7.5–12.5)
Monocytes Relative: 5 %
NEUTROS ABS: 4565 {cells}/uL (ref 1500–7800)
Neutrophils Relative %: 55 %
Platelets: 247 10*3/uL (ref 140–400)
RBC: 4.59 MIL/uL (ref 3.80–5.10)
RDW: 14.5 % (ref 11.0–15.0)
WBC: 8.3 10*3/uL (ref 3.8–10.8)

## 2016-04-09 LAB — TSH: TSH: 7.74 m[IU]/L — AB

## 2016-04-09 LAB — T4, FREE: FREE T4: 1.2 ng/dL (ref 0.8–1.8)

## 2016-04-09 MED ORDER — DICLOFENAC SODIUM 1 % TD GEL: 2.0000 g | Freq: Four times a day (QID) | TRANSDERMAL | Status: DC

## 2016-04-09 MED ORDER — LOSARTAN POTASSIUM 100 MG PO TABS: 100.0000 mg | ORAL_TABLET | Freq: Every day | ORAL | Status: DC

## 2016-04-09 NOTE — Progress Notes (Signed)
Subjective:    Patient ID: Regina Evans, female    DOB: 1941-02-03, 75 y.o.   MRN: [id number]  04/09/2016  follow up bp   HPI This 75 y.o. female presents for evaluation six month follow-up for the following:  1. Left fourth finger swelling: cannot remove ring.  2.  HTN: Patient reports good compliance with medication, good tolerance to medication, and good symptom control.  Losartan increased to 100mg  daily.  Cough resolved.  Started taking Losartan earlier in the day.    3.  Hypercholesterolemia: Patient reports good compliance with medication, good tolerance to medication, and good symptom control.    4.  Anxiety and depression: Patient reports good compliance with medication, good tolerance to medication, and good symptom control.    5.  Hypothyroidism: Patient reports good compliance with medication, good tolerance to medication, and good symptom control.     Review of Systems  Constitutional: Negative for fever, chills, diaphoresis and fatigue.  Eyes: Negative for visual disturbance.  Respiratory: Negative for cough and shortness of breath.   Cardiovascular: Negative for chest pain, palpitations and leg swelling.  Gastrointestinal: Negative for nausea, vomiting, abdominal pain, diarrhea and constipation.  Endocrine: Negative for cold intolerance, heat intolerance, polydipsia, polyphagia and polyuria.  Musculoskeletal: Positive for joint swelling and arthralgias.  Neurological: Negative for dizziness, tremors, seizures, syncope, facial asymmetry, speech difficulty, weakness, light-headedness, numbness and headaches.    Past Medical History  Diagnosis Date  . GERD (gastroesophageal reflux disease)   . Arthritis   . Osteoarthritis     hands  . Osteopenia   . Other anxiety states   . Unspecified hypothyroidism   . Headache(784.0)     none since hysterectomy  . Essential hypertension, benign   . Pure hypercholesterolemia   . Unspecified glaucoma   . Depression   .  Diverticulosis     no polyps in 2012 repeat colonoscopy in 5 years  . Cataract    Past Surgical History  Procedure Laterality Date  . Tonsillectomy  1949  . Maxillary and mandibular surgery  1991  . Total abdominal hysterectomy  1991    ovaries removed;fibroids  . Tube in right ear  2010  . Tubal ligation     Allergies  Allergen Reactions  . Cefdinir     Rash    Social History   Social History  . Marital Status: Married    Spouse Name: N/A  . Number of Children: 2  . Years of Education: N/A   Occupational History  . retired    Social History Main Topics  . Smoking status: Never Smoker   . Smokeless tobacco: Not on file  . Alcohol Use: Yes     Comment: moderate 5-6 drinks per week (1-2 drinks/wine or liquor each night  . Drug Use: No  . Sexual Activity: Yes    Birth Control/ Protection: None     Comment: number of sex partners in the last 64 months  1   Other Topics Concern  . Not on file   Social History Narrative   Married x 52 years.       Children 2 daughters (47, 48); no grandchildren.      Lives:  With husband.      Employment:  Retired.      Tobacco:  none      Alcohol:  Wine or mixed drink; 1-3 drinks five days per week.     Caffeine use 3 servings per day.  Exercise: Inactive sporadic walking.       Guns in the home stored in locked cabinet. Always uses seat belts.      ADLs; independent with ADLs; no assistant devices.      Advanced Directives: yes; FULL CODE but no prolonged measures.    Family History  Problem Relation Age of Onset  . Hypertension Mother   . Alzheimer's disease Mother     Hospice care  . Macular degeneration Mother   . Colon polyps Mother   . Cancer Father     prostate  . Diabetes Brother   . Alzheimer's disease Maternal Grandmother   . Cancer Paternal Grandmother   . Dementia Paternal Grandfather   . Cirrhosis Paternal Grandfather   . Breast cancer Neg Hx        Objective:    BP 123/75 mmHg  Pulse 56   Temp(Src) 97.4 F (36.3 C) (Oral)  Resp 16  Ht 5\' 4"  (1.626 m)  Wt 166 lb 6.4 oz (75.479 kg)  BMI 28.55 kg/m2 Physical Exam  Constitutional: She is oriented to person, place, and time. She appears well-developed and well-nourished. No distress.  HENT:  Head: Normocephalic and atraumatic.  Eyes: Conjunctivae are normal. Pupils are equal, round, and reactive to light.  Neck: Normal range of motion. Neck supple.  Cardiovascular: Normal rate, regular rhythm and normal heart sounds.  Exam reveals no gallop and no friction rub.   No murmur heard. Pulmonary/Chest: Effort normal and breath sounds normal. She has no wheezes. She has no rales.  Musculoskeletal:  L fourth finger swelling at PIP joint. Non-tender.  Neurological: She is alert and oriented to person, place, and time.  Skin: She is not diaphoretic.  Psychiatric: She has a normal mood and affect. Her behavior is normal.  Nursing note and vitals reviewed.  Depression screen [facility] 2/9 04/09/2016 09/22/2015 03/15/2015 09/07/2014 06/06/2014  Decreased Interest 0 0 0 0 0  Down, Depressed, Hopeless 0 0 0 0 0  PHQ - 2 Score 0 0 0 0 0         Assessment & Plan:   1. Pure hypercholesterolemia   2. Essential hypertension, benign   3. Depression with anxiety   4. Allergic rhinitis due to pollen   5. Gastroesophageal reflux disease without esophagitis   6. Hypothyroidism due to acquired atrophy of thyroid   7. Left trigger finger     Orders Placed This Encounter  Procedures  . CBC with Differential/Platelet  . Comprehensive metabolic panel    Order Specific Question:  Has the patient fasted?    Answer:  Yes  . Lipid panel    Order Specific Question:  Has the patient fasted?    Answer:  Yes  . TSH  . T4, free   Meds ordered this encounter  Medications  . sodium chloride (MURO 128) 2 % ophthalmic solution    Sig: 1 drop.  Marland Kitchen losartan (COZAAR) 100 MG tablet    Sig: Take 1 tablet (100 mg total) by mouth daily.    Dispense:  90  tablet    Refill:  3  . diclofenac sodium (VOLTAREN) 1 % GEL    Sig: Apply 2 g topically 4 (four) times daily.    Dispense:  100 g    Refill:  0    Return in about 6 months (around 10/09/2016) for complete physical examiniation.    Rejina Odle Elayne Guerin, M.D. [facility] [place], [place]  [address] [phone] phone [phone] fax

## 2016-04-09 NOTE — Patient Instructions (Addendum)
     IF you received an x-ray today, you will receive an invoice from [facility]. Please contact [facility] at [phone] with questions or concerns regarding your invoice.   IF you received labwork today, you will receive an invoice from [employer]. Please contact [facility] at [phone] with questions or concerns regarding your invoice.   Our billing staff will not be able to assist you with questions regarding bills from these companies.  You will be contacted with the lab results as soon as they are available. The fastest way to get your results is to activate your My Chart account. Instructions are located on the last page of this paperwork. If you have not heard from [place] regarding the results in 2 weeks, please contact this office.    Trigger Finger Trigger finger (digital tendinitis and stenosing tenosynovitis) is a common disorder that causes an often painful catching of the fingers or thumb. It occurs as a clicking, snapping, or locking of a finger in the palm of the hand. This is caused by a problem with the tendons that flex or bend the fingers sliding smoothly through their sheaths. The condition may occur in any finger or a couple fingers at the same time.  The finger may lock with the finger curled or suddenly straighten out with a snap. This is more common in patients with rheumatoid arthritis and diabetes. Left untreated, the condition may get worse to the point where the finger becomes locked in flexion, like making a fist, or less commonly locked with the finger straightened out. CAUSES   Inflammation and scarring that lead to swelling around the tendon sheath.  Repeated or forceful movements.  Rheumatoid arthritis, an autoimmune disease that affects joints.  Gout.  Diabetes mellitus. SIGNS AND SYMPTOMS  Soreness and swelling of your finger.  A painful clicking or snapping as you bend and straighten your  finger. DIAGNOSIS  Your health care provider will do a physical exam of your finger to diagnose trigger finger. TREATMENT   Splinting for 6-8 weeks may be helpful.  Nonsteroidal anti-inflammatory medicines (NSAIDs) can help to relieve the pain and inflammation.  Cortisone injections, along with splinting, may speed up recovery. Several injections may be required. Cortisone may give relief after one injection.  Surgery is another treatment that may be used if conservative treatments do not work. Surgery can be minor, without incisions (a cut does not have to be made), and can be done with a needle through the skin.  Other surgical choices involve an open procedure in which the surgeon opens the hand through a small incision and cuts the pulley so the tendon can again slide smoothly. Your hand will still work fine. HOME CARE INSTRUCTIONS  Apply ice to the injured area, twice per day:  Put ice in a plastic bag.  Place a towel between your skin and the bag.  Leave the ice on for 20 minutes, 3-4 times a day.  Rest your hand often. MAKE SURE YOU:   Understand these instructions.  Will watch your condition.  Will get help right away if you are not doing well or get worse.   This information is not intended to replace advice given to you by your health care provider. Make sure you discuss any questions you have with your health care provider.   Document Released: 07/27/2004 Document Revised: 06/09/2013 Document Reviewed: 03/09/2013 Elsevier Interactive Patient Education [employer].

## 2016-04-10 ENCOUNTER — Encounter: Payer: Self-pay | Admitting: Family Medicine

## 2016-05-06 MED ORDER — LEVOTHYROXINE SODIUM 112 MCG PO TABS: 112.0000 ug | ORAL_TABLET | Freq: Every day | ORAL | Status: DC

## 2016-05-06 NOTE — Addendum Note (Signed)
Addended by: [name] on: 05/06/2016 03:23 PM   Modules accepted: Orders

## 2016-05-10 ENCOUNTER — Telehealth: Payer: Self-pay

## 2016-05-10 NOTE — Telephone Encounter (Signed)
fax req [place] -for Alprazolam 0.5mg  #30 - sent to [facility]

## 2016-05-14 MED ORDER — ALPRAZOLAM 0.5 MG PO TABS: 0.5000 mg | ORAL_TABLET | Freq: Every evening | ORAL | 2 refills | Status: DC | PRN

## 2016-05-14 NOTE — Telephone Encounter (Signed)
Called in.

## 2016-05-14 NOTE — Telephone Encounter (Signed)
Please call in refill of Xanax as approved since I am out of town.

## 2016-09-14 ENCOUNTER — Other Ambulatory Visit: Payer: Self-pay | Admitting: Family Medicine

## 2016-10-07 ENCOUNTER — Other Ambulatory Visit: Payer: Self-pay | Admitting: Family Medicine

## 2016-10-07 DIAGNOSIS — F418 Other specified anxiety disorders: Secondary | ICD-10-CM

## 2016-10-23 ENCOUNTER — Ambulatory Visit (INDEPENDENT_AMBULATORY_CARE_PROVIDER_SITE_OTHER): Payer: Medicare Other | Admitting: Family Medicine

## 2016-10-23 ENCOUNTER — Encounter: Payer: Self-pay | Admitting: Family Medicine

## 2016-10-23 VITALS — BP 144/86 | HR 69 | Temp 98.7°F | Resp 18 | Ht 64.0 in | Wt 171.0 lb

## 2016-10-23 DIAGNOSIS — I10 Essential (primary) hypertension: Secondary | ICD-10-CM

## 2016-10-23 DIAGNOSIS — Z0000 Encounter for general adult medical examination without abnormal findings: Secondary | ICD-10-CM | POA: Diagnosis not present

## 2016-10-23 DIAGNOSIS — J301 Allergic rhinitis due to pollen: Secondary | ICD-10-CM

## 2016-10-23 DIAGNOSIS — R7302 Impaired glucose tolerance (oral): Secondary | ICD-10-CM

## 2016-10-23 DIAGNOSIS — K219 Gastro-esophageal reflux disease without esophagitis: Secondary | ICD-10-CM | POA: Diagnosis not present

## 2016-10-23 DIAGNOSIS — F418 Other specified anxiety disorders: Secondary | ICD-10-CM

## 2016-10-23 DIAGNOSIS — E7800 Pure hypercholesterolemia, unspecified: Secondary | ICD-10-CM | POA: Diagnosis not present

## 2016-10-23 DIAGNOSIS — E034 Atrophy of thyroid (acquired): Secondary | ICD-10-CM | POA: Diagnosis not present

## 2016-10-23 LAB — POCT URINALYSIS DIP (MANUAL ENTRY)
Bilirubin, UA: NEGATIVE
GLUCOSE UA: NEGATIVE
Ketones, POC UA: NEGATIVE
LEUKOCYTES UA: NEGATIVE
NITRITE UA: NEGATIVE
Protein Ur, POC: NEGATIVE
SPEC GRAV UA: 1.02
Urobilinogen, UA: 0.2
pH, UA: 5

## 2016-10-23 MED ORDER — CITALOPRAM HYDROBROMIDE 20 MG PO TABS: 20.0000 mg | ORAL_TABLET | Freq: Every day | ORAL | 3 refills | Status: DC

## 2016-10-23 MED ORDER — LEVOTHYROXINE SODIUM 112 MCG PO TABS: 112.0000 ug | ORAL_TABLET | Freq: Every day | ORAL | 3 refills | Status: DC

## 2016-10-23 MED ORDER — LOSARTAN POTASSIUM 100 MG PO TABS: 100.0000 mg | ORAL_TABLET | Freq: Every day | ORAL | 3 refills | Status: DC

## 2016-10-23 MED ORDER — LOVASTATIN 20 MG PO TABS: 20.0000 mg | ORAL_TABLET | Freq: Every day | ORAL | 0 refills | Status: DC

## 2016-10-23 MED ORDER — ALPRAZOLAM 0.5 MG PO TABS: 0.5000 mg | ORAL_TABLET | Freq: Every evening | ORAL | 2 refills | Status: DC | PRN

## 2016-10-23 NOTE — Progress Notes (Signed)
Subjective:    Patient ID: Regina Evans, female    DOB: August 05, 1941, 76 y.o.   MRN: [id number]  10/23/2016  Annual Exam   HPI This 76 y.o. female presents for Complete Physical Examination and Annual Wellness Examination and follow-up of chronic medical conditions.  Last physical:  09-22-2015 Pap smear: n/a Mammogram: 10-26-2015 Colonoscopy: 09-05-2010; Regina Evans Bone Density Scan: 2012 Eye exam:  Cataracts scheduled next week; 09/23/2016. [place]. Additional surgeries on eyes B.  Evans is regular ophthalmology.  Also glaucoma.  Macular degeneration.   Dental exam:  Immunization History  Administered Date(s) Administered  . Influenza Split 07/24/2010, 07/10/2011, 07/13/2012  . Influenza,inj,Quad PF,36+ Mos 08/11/2013  . Influenza-Unspecified 05/27/2014, 08/30/2015, 08/29/2016  . Pneumococcal Conjugate-13 09/07/2014  . Pneumococcal-Unspecified 11/05/2010  . Tdap 07/10/2011  . Zoster 08/26/2013   BP Readings from Last 3 Encounters:  10/23/16 (!) 144/86  04/09/16 123/75  09/22/15 129/76   Wt Readings from Last 3 Encounters:  10/23/16 171 lb (77.6 kg)  04/09/16 166 lb 6.4 oz (75.5 kg)  09/22/15 164 lb (74.4 kg)    HTN: Patient reports good compliance with medication, good tolerance to medication, and good symptom control.  Home BPs running 115/68, 123/65, 113/66.  Switched to wrist cuff.  Hypercholesterolemia: Patient reports good compliance with medication, good tolerance to medication, and good symptom control.    Hypothyroidism: Patient reports good compliance with medication, good tolerance to medication, and good symptom control.     Insomnia: taking Xanax 1/2 qhs right now; not every night.   Review of Systems  Constitutional: Negative for activity change, appetite change, chills, diaphoresis, fatigue, fever and unexpected weight change.  HENT: Negative for congestion, dental problem, drooling, ear discharge, ear pain, facial swelling, hearing loss, mouth sores,  nosebleeds, postnasal drip, rhinorrhea, sinus pressure, sneezing, sore throat, tinnitus, trouble swallowing and voice change.   Eyes: Negative for photophobia, pain, discharge, redness, itching and visual disturbance.  Respiratory: Negative for apnea, cough, choking, chest tightness, shortness of breath, wheezing and stridor.   Cardiovascular: Negative for chest pain, palpitations and leg swelling.  Gastrointestinal: Negative for abdominal distention, abdominal pain, anal bleeding, blood in stool, constipation, diarrhea, nausea, rectal pain and vomiting.  Endocrine: Negative for cold intolerance, heat intolerance, polydipsia, polyphagia and polyuria.  Genitourinary: Negative for decreased urine volume, difficulty urinating, dyspareunia, dysuria, enuresis, flank pain, frequency, genital sores, hematuria, menstrual problem, pelvic pain, urgency, vaginal bleeding, vaginal discharge and vaginal pain.       Nocturia x 1.  Urinary leakage during the day.  Musculoskeletal: Negative for arthralgias, back pain, gait problem, joint swelling, myalgias, neck pain and neck stiffness.  Skin: Negative for color change, pallor, rash and wound.  Allergic/Immunologic: Negative for environmental allergies, food allergies and immunocompromised state.  Neurological: Negative for dizziness, tremors, seizures, syncope, facial asymmetry, speech difficulty, weakness, light-headedness, numbness and headaches.  Hematological: Negative for adenopathy. Does not bruise/bleed easily.  Psychiatric/Behavioral: Negative for agitation, behavioral problems, confusion, decreased concentration, dysphoric mood, hallucinations, self-injury, sleep disturbance and suicidal ideas. The patient is not nervous/anxious and is not hyperactive.        Bedtime 9-10; wakes up 6:00.    Past Medical History:  Diagnosis Date  . Arthritis   . Cataract   . Depression   . Diverticulosis    no polyps in 2012 repeat colonoscopy in 5 years  .  Essential hypertension, benign   . GERD (gastroesophageal reflux disease)   . Headache(784.0)    none since hysterectomy  . Osteoarthritis  hands  . Osteopenia   . Other anxiety states   . Pure hypercholesterolemia   . Unspecified glaucoma(365.9)   . Unspecified hypothyroidism    Past Surgical History:  Procedure Laterality Date  . maxillary and mandibular surgery  1991  . TONSILLECTOMY  1949  . TOTAL ABDOMINAL HYSTERECTOMY  1991   ovaries removed;fibroids  . TUBAL LIGATION    . tube in right ear  2010   Allergies  Allergen Reactions  . Cefdinir     Rash    Social History   Social History  . Marital status: Married    Spouse name: N/A  . Number of children: 2  . Years of education: N/A   Occupational History  . retired    Social History Main Topics  . Smoking status: Never Smoker  . Smokeless tobacco: Never Used  . Alcohol use Yes     Comment: moderate 5-6 drinks per week (1-2 drinks/wine or liquor each night  . Drug use: No  . Sexual activity: Yes    Birth control/ protection: None     Comment: number of sex partners in the last 58 months  1   Other Topics Concern  . Not on file   Social History Narrative   Marital status:  Married x 54 years.       Children 2 daughters (49, 72); no grandchildren.      Lives:  With husband. Has a beach house.      Employment:  Retired.      Tobacco:  none      Alcohol:  Wine or mixed drink; 1-3 drinks five days per week.     Caffeine use 3 servings per day.       Exercise: Inactive sporadic walking.  Walking some; 2-3 times per week.      Guns in the home stored in locked cabinet. Always uses seat belts.      ADLs; independent with ADLs; no assistant devices.      Advanced Directives: yes; FULL CODE but no prolonged measures.    Family History  Problem Relation Age of Onset  . Hypertension Mother   . Alzheimer's disease Mother     Hospice care  . Macular degeneration Mother   . Colon polyps Mother   . Cancer  Father     prostate  . Diabetes Brother   . Alzheimer's disease Maternal Grandmother   . Cancer Paternal Grandmother   . Dementia Paternal Grandfather   . Cirrhosis Paternal Grandfather   . Breast cancer Neg Hx        Objective:    BP (!) 144/86 (BP Location: Right Arm, Patient Position: Sitting, Cuff Size: Small)   Pulse 69   Temp 98.7 F (37.1 C) (Oral)   Resp 18   Ht _0  (1.626 m)   Wt 171 lb (77.6 kg)   SpO2 95%   BMI 29.35 kg/m  Physical Exam  Constitutional: She is oriented to person, place, and time. She appears well-developed and well-nourished. No distress.  HENT:  Head: Normocephalic and atraumatic.  Right Ear: External ear normal.  Left Ear: External ear normal.  Nose: Nose normal.  Mouth/Throat: Oropharynx is clear and moist.  Eyes: Conjunctivae and EOM are normal. Pupils are equal, round, and reactive to light.  Neck: Normal range of motion and full passive range of motion without pain. Neck supple. No JVD present. Carotid bruit is not present. No thyromegaly present.  Cardiovascular: Normal rate, regular rhythm and normal  heart sounds.  Exam reveals no gallop and no friction rub.   No murmur heard. Pulmonary/Chest: Effort normal and breath sounds normal. She has no wheezes. She has no rales. Right breast exhibits no inverted nipple, no mass, no nipple discharge, no skin change and no tenderness. Left breast exhibits no inverted nipple, no mass, no nipple discharge, no skin change and no tenderness. Breasts are symmetrical.  Abdominal: Soft. Bowel sounds are normal. She exhibits no distension and no mass. There is no tenderness. There is no rebound and no guarding.  Musculoskeletal:       Right shoulder: Normal.       Left shoulder: Normal.       Cervical back: Normal.  Lymphadenopathy:    She has no cervical adenopathy.  Neurological: She is alert and oriented to person, place, and time. She has normal reflexes. No cranial nerve deficit. She exhibits normal  muscle tone. Coordination normal.  Skin: Skin is warm and dry. No rash noted. She is not diaphoretic. No erythema. No pallor.  Psychiatric: She has a normal mood and affect. Her behavior is normal. Judgment and thought content normal.  Nursing note and vitals reviewed.  Results for orders placed or performed in visit on 04/09/16  CBC with Differential/Platelet  Result Value Ref Range   WBC 8.3 3.8 - 10.8 K/uL   RBC 4.59 3.80 - 5.10 MIL/uL   Hemoglobin 13.6 11.7 - 15.5 g/dL   HCT 40.8 35.0 - 45.0 %   MCV 88.9 80.0 - 100.0 fL   MCH 29.6 27.0 - 33.0 pg   MCHC 33.3 32.0 - 36.0 g/dL   RDW 14.5 11.0 - 15.0 %   Platelets 247 140 - 400 K/uL   MPV 9.6 7.5 - 12.5 fL   Neutro Abs 4,565 1,500 - 7,800 cells/uL   Lymphs Abs 2,905 850 - 3,900 cells/uL   Monocytes Absolute 415 200 - 950 cells/uL   Eosinophils Absolute 332 15 - 500 cells/uL   Basophils Absolute 83 0 - 200 cells/uL   Neutrophils Relative % 55 %   Lymphocytes Relative 35 %   Monocytes Relative 5 %   Eosinophils Relative 4 %   Basophils Relative 1 %   Smear Review Criteria for review not met   Comprehensive metabolic panel  Result Value Ref Range   Sodium 138 135 - 146 mmol/L   Potassium 4.4 3.5 - 5.3 mmol/L   Chloride 106 98 - 110 mmol/L   CO2 25 20 - 31 mmol/L   Glucose, Bld 81 65 - 99 mg/dL   BUN 11 7 - 25 mg/dL   Creat 0.94 (H) 0.60 - 0.93 mg/dL   Total Bilirubin 0.5 0.2 - 1.2 mg/dL   Alkaline Phosphatase 91 33 - 130 U/L   AST 21 10 - 35 U/L   ALT 19 6 - 29 U/L   Total Protein 7.0 6.1 - 8.1 g/dL   Albumin 4.3 3.6 - 5.1 g/dL   Calcium 9.4 8.6 - 10.4 mg/dL  Lipid panel  Result Value Ref Range   Cholesterol 169 125 - 200 mg/dL   Triglycerides 187 (H) <150 mg/dL   HDL 47 >=46 mg/dL   Total CHOL/HDL Ratio 3.6 <=5.0 Ratio   VLDL 37 (H) <30 mg/dL   LDL Cholesterol 85 <130 mg/dL  TSH  Result Value Ref Range   TSH 7.74 (H) mIU/L  T4, free  Result Value Ref Range   Free T4 1.2 0.8 - 1.8 ng/dL   Depression screen PHQ  2/9 10/23/2016 04/09/2016 09/22/2015 03/15/2015 09/07/2014  Decreased Interest 0 0 0 0 0  Down, Depressed, Hopeless 0 0 0 0 0  PHQ - 2 Score 0 0 0 0 0   Fall Risk  10/23/2016 04/09/2016 09/22/2015 09/07/2014 06/06/2014  Falls in the past year? No No No Yes Yes  Number falls in past yr: - - - 2 or more 1  Injury with Fall? - - - - No   Functional Status Survey: Is the patient deaf or have difficulty hearing?: No Does the patient have difficulty seeing, even when wearing glasses/contacts?: No Does the patient have difficulty concentrating, remembering, or making decisions?: No Does the patient have difficulty walking or climbing stairs?: No Does the patient have difficulty dressing or bathing?: No Does the patient have difficulty doing errands alone such as visiting a doctor's office or shopping?: No     Assessment & Plan:   1. Encounter for Medicare annual wellness exam   2. Routine physical examination   3. Essential hypertension, benign   4. Acute seasonal allergic rhinitis due to pollen   5. Gastroesophageal reflux disease without esophagitis   6. Hypothyroidism due to acquired atrophy of thyroid   7. Pure hypercholesterolemia   8. Depression with anxiety   9. Glucose intolerance (impaired glucose tolerance)     Orders Placed This Encounter  Procedures  . CBC with Differential/Platelet  . Comprehensive metabolic panel    Order Specific Question:   Has the patient fasted?    Answer:   Yes  . Lipid panel    Order Specific Question:   Has the patient fasted?    Answer:   Yes  . TSH  . T4, free  . Hemoglobin A1c  . POCT urinalysis dipstick  . EKG 12-Lead   Meds ordered this encounter  Medications  . ALPRAZolam (XANAX) 0.5 MG tablet    Sig: Take 1 tablet (0.5 mg total) by mouth at bedtime as needed.    Dispense:  30 tablet    Refill:  2  . citalopram (CELEXA) 20 MG tablet    Sig: Take 1 tablet (20 mg total) by mouth daily.    Dispense:  90 tablet    Refill:  3  .  levothyroxine (SYNTHROID, LEVOTHROID) 112 MCG tablet    Sig: Take 1 tablet (112 mcg total) by mouth daily.    Dispense:  90 tablet    Refill:  3  . losartan (COZAAR) 100 MG tablet    Sig: Take 1 tablet (100 mg total) by mouth daily.    Dispense:  90 tablet    Refill:  3  . lovastatin (MEVACOR) 20 MG tablet    Sig: Take 1 tablet (20 mg total) by mouth at bedtime.    Dispense:  90 tablet    Refill:  0    Return in about 6 months (around 04/22/2017) for recheck high blood pressure, high cholesterol, hypothyroidism.   Kristi Elayne Guerin, M.D. [facility] [address] [place], [place]  [phone] phone [phone] fax

## 2016-10-23 NOTE — Patient Instructions (Addendum)
Shoulder Impingement Syndrome Rehab Ask your health care provider which exercises are safe for you. Do exercises exactly as told by your health care provider and adjust them as directed. It is normal to feel mild stretching, pulling, tightness, or discomfort as you do these exercises, but you should stop right away if you feel sudden pain or your pain gets worse.Do not begin these exercises until told by your health care provider. Stretching and range of motion exercise This exercise warms up your muscles and joints and improves the movement and flexibility of your shoulder. This exercise also helps to relieve pain and stiffness. Exercise A: Passive horizontal adduction 1. Sit or stand and pull your left / right elbow across your chest, toward your other shoulder. Stop when you feel a gentle stretch in the back of your shoulder and upper arm.  Keep your arm at shoulder height.  Keep your arm as close to your body as you comfortably can. 2. Hold for __________ seconds. 3. Slowly return to the starting position. Repeat __________ times. Complete this exercise __________ times a day. Strengthening exercises These exercises build strength and endurance in your shoulder. Endurance is the ability to use your muscles for a long time, even after they get tired. Exercise B: External rotation, isometric 1. Stand or sit in a doorway, facing the door frame. 2. Bend your left / right elbow and place the back of your wrist against the door frame. Only your wrist should be touching the frame. Keep your upper arm at your side. 3. Gently press your wrist against the door frame, as if you are trying to push your arm away from your abdomen.  Avoid shrugging your shoulder while you press your hand against the door frame. Keep your shoulder blade tucked down toward the middle of your back. 4. Hold for __________ seconds. 5. Slowly release the tension, and relax your muscles completely before you do the exercise  again. Repeat __________ times. Complete this exercise __________ times a day. Exercise C: Internal rotation, isometric 1. Stand or sit in a doorway, facing the door frame. 2. Bend your left / right elbow and place the inside of your wrist against the door frame. Only your wrist should be touching the frame. Keep your upper arm at your side. 3. Gently press your wrist against the door frame, as if you are trying to push your arm toward your abdomen.  Avoid shrugging your shoulder while you press your hand against the door frame. Keep your shoulder blade tucked down toward the middle of your back. 4. Hold for __________ seconds. 5. Slowly release the tension, and relax your muscles completely before you do the exercise again. Repeat __________ times. Complete this exercise __________ times a day. Exercise D: Scapular protraction, supine 1. Lie on your back on a firm surface. Hold a __________ weight in your left / right hand. 2. Raise your left / right arm straight into the air so your hand is directly above your shoulder joint. 3. Push the weight into the air so your shoulder lifts off of the surface that you are lying on. Do not move your head, neck, or back. 4. Hold for __________ seconds. 5. Slowly return to the starting position. Let your muscles relax completely before you repeat this exercise. Repeat __________ times. Complete this exercise __________ times a day. Exercise E: Scapular retraction 1. Sit in a stable chair without armrests, or stand. 2. Secure an exercise band to a stable object in front of   you so the band is at shoulder height. 3. Hold one end of the exercise band in each hand. Your palms should face down. 4. Squeeze your shoulder blades together and move your elbows slightly behind you. Do not shrug your shoulders while you do this. 5. Hold for __________ seconds. 6. Slowly return to the starting position. Repeat __________ times. Complete this exercise __________ times  a day. Exercise F: Shoulder extension 1. Sit in a stable chair without armrests, or stand. 2. Secure an exercise band to a stable object in front of you where the band is above shoulder height. 3. Hold one end of the exercise band in each hand. 4. Straighten your elbows and lift your hands up to shoulder height. 5. Squeeze your shoulder blades together and pull your hands down to the sides of your thighs. Stop when your hands are straight down by your sides. Do not let your hands go behind your body. 6. Hold for __________ seconds. 7. Slowly return to the starting position. Repeat __________ times. Complete this exercise __________ times a day. This information is not intended to replace advice given to you by your health care provider. Make sure you discuss any questions you have with your health care provider. Document Released: 10/07/2005 Document Revised: 06/13/2016 Document Reviewed: 09/09/2015 Elsevier Interactive Patient Education  2017 [employer].     IF you received an x-ray today, you will receive an invoice from [facility]. Please contact [facility] at [phone] with questions or concerns regarding your invoice.   IF you received labwork today, you will receive an invoice from [facility]. Please contact [facility] at [phone] with questions or concerns regarding your invoice.   Our billing staff will not be able to assist you with questions regarding bills from these companies.  You will be contacted with the lab results as soon as they are available. The fastest way to get your results is to activate your My Chart account. Instructions are located on the last page of this paperwork. If you have not heard from [place] regarding the results in 2 weeks, please contact this office.

## 2016-10-24 LAB — COMPREHENSIVE METABOLIC PANEL
ALBUMIN: 4.2 g/dL (ref 3.5–4.8)
ALT: 23 IU/L (ref 0–32)
AST: 21 IU/L (ref 0–40)
Albumin/Globulin Ratio: 1.5 (ref 1.2–2.2)
Alkaline Phosphatase: 113 IU/L (ref 39–117)
BUN / CREAT RATIO: 10 — AB (ref 12–28)
BUN: 8 mg/dL (ref 8–27)
Bilirubin Total: 0.3 mg/dL (ref 0.0–1.2)
CALCIUM: 9.4 mg/dL (ref 8.7–10.3)
CO2: 23 mmol/L (ref 18–29)
CREATININE: 0.81 mg/dL (ref 0.57–1.00)
Chloride: 105 mmol/L (ref 96–106)
GFR calc Af Amer: 83 mL/min/{1.73_m2} (ref >59)
GFR, EST NON AFRICAN AMERICAN: 72 mL/min/{1.73_m2} (ref >59)
GLOBULIN, TOTAL: 2.8 g/dL (ref 1.5–4.5)
Glucose: 93 mg/dL (ref 65–99)
Potassium: 4.8 mmol/L (ref 3.5–5.2)
SODIUM: 141 mmol/L (ref 134–144)
TOTAL PROTEIN: 7 g/dL (ref 6.0–8.5)

## 2016-10-24 LAB — CBC WITH DIFFERENTIAL/PLATELET
Basophils Absolute: 0.1 10*3/uL (ref 0.0–0.2)
Basos: 1 %
EOS (ABSOLUTE): 0.2 10*3/uL (ref 0.0–0.4)
Eos: 3 %
HEMATOCRIT: 41 % (ref 34.0–46.6)
Hemoglobin: 13.6 g/dL (ref 11.1–15.9)
IMMATURE GRANS (ABS): 0 10*3/uL (ref 0.0–0.1)
IMMATURE GRANULOCYTES: 0 %
LYMPHS ABS: 2.1 10*3/uL (ref 0.7–3.1)
Lymphs: 29 %
MCH: 30.1 pg (ref 26.6–33.0)
MCHC: 33.2 g/dL (ref 31.5–35.7)
MCV: 91 fL (ref 79–97)
Monocytes Absolute: 0.5 10*3/uL (ref 0.1–0.9)
Monocytes: 7 %
Neutrophils Absolute: 4.5 10*3/uL (ref 1.4–7.0)
Neutrophils: 60 %
Platelets: 279 10*3/uL (ref 150–379)
RBC: 4.52 x10E6/uL (ref 3.77–5.28)
RDW: 14 % (ref 12.3–15.4)
WBC: 7.4 10*3/uL (ref 3.4–10.8)

## 2016-10-24 LAB — LIPID PANEL
Chol/HDL Ratio: 3.6 ratio units (ref 0.0–4.4)
Cholesterol, Total: 161 mg/dL (ref 100–199)
HDL: 45 mg/dL (ref >39)
LDL CALC: 87 mg/dL (ref 0–99)
Triglycerides: 143 mg/dL (ref 0–149)
VLDL CHOLESTEROL CAL: 29 mg/dL (ref 5–40)

## 2016-10-24 LAB — T4, FREE: FREE T4: 1.57 ng/dL (ref 0.82–1.77)

## 2016-10-24 LAB — TSH: TSH: 2.72 u[IU]/mL (ref 0.450–4.500)

## 2016-10-24 LAB — HEMOGLOBIN A1C
Est. average glucose Bld gHb Est-mCnc: 117 mg/dL
HEMOGLOBIN A1C: 5.7 % — AB (ref 4.8–5.6)

## 2017-03-10 ENCOUNTER — Other Ambulatory Visit: Payer: Self-pay | Admitting: Family Medicine

## 2017-05-06 ENCOUNTER — Encounter: Payer: Self-pay | Admitting: Family Medicine

## 2017-05-06 ENCOUNTER — Ambulatory Visit (INDEPENDENT_AMBULATORY_CARE_PROVIDER_SITE_OTHER): Payer: Medicare Other | Admitting: Family Medicine

## 2017-05-06 VITALS — BP 137/77 | HR 64 | Temp 98.0°F | Resp 18 | Ht 63.39 in | Wt 164.4 lb

## 2017-05-06 DIAGNOSIS — J301 Allergic rhinitis due to pollen: Secondary | ICD-10-CM | POA: Diagnosis not present

## 2017-05-06 DIAGNOSIS — F418 Other specified anxiety disorders: Secondary | ICD-10-CM

## 2017-05-06 DIAGNOSIS — E7439 Other disorders of intestinal carbohydrate absorption: Secondary | ICD-10-CM

## 2017-05-06 DIAGNOSIS — E7800 Pure hypercholesterolemia, unspecified: Secondary | ICD-10-CM | POA: Diagnosis not present

## 2017-05-06 DIAGNOSIS — E2839 Other primary ovarian failure: Secondary | ICD-10-CM

## 2017-05-06 DIAGNOSIS — Z23 Encounter for immunization: Secondary | ICD-10-CM

## 2017-05-06 DIAGNOSIS — Z1231 Encounter for screening mammogram for malignant neoplasm of breast: Secondary | ICD-10-CM | POA: Diagnosis not present

## 2017-05-06 DIAGNOSIS — E034 Atrophy of thyroid (acquired): Secondary | ICD-10-CM

## 2017-05-06 DIAGNOSIS — I10 Essential (primary) hypertension: Secondary | ICD-10-CM

## 2017-05-06 DIAGNOSIS — K219 Gastro-esophageal reflux disease without esophagitis: Secondary | ICD-10-CM

## 2017-05-06 MED ORDER — LOSARTAN POTASSIUM 100 MG PO TABS: 100.0000 mg | ORAL_TABLET | Freq: Every day | ORAL | 3 refills | Status: DC

## 2017-05-06 MED ORDER — LOVASTATIN 20 MG PO TABS: 20.0000 mg | ORAL_TABLET | Freq: Every day | ORAL | 3 refills | Status: DC

## 2017-05-06 MED ORDER — ZOSTER VAC RECOMB ADJUVANTED 50 MCG/0.5ML IM SUSR: 0.5000 mL | Freq: Once | INTRAMUSCULAR | 1 refills | Status: AC

## 2017-05-06 MED ORDER — ALPRAZOLAM 0.5 MG PO TABS: 0.5000 mg | ORAL_TABLET | Freq: Every evening | ORAL | 2 refills | Status: DC | PRN

## 2017-05-06 NOTE — Patient Instructions (Addendum)
Fat and Cholesterol Restricted Diet Getting too much fat and cholesterol in your diet may cause health problems. Following this diet helps keep your fat and cholesterol at normal levels. This can keep you from getting sick. What types of fat should I choose?  Choose monosaturated and polyunsaturated fats. These are found in foods such as olive oil, canola oil, flaxseeds, walnuts, almonds, and seeds.  Eat more omega-3 fats. Good choices include salmon, mackerel, sardines, tuna, flaxseed oil, and ground flaxseeds.  Limit saturated fats. These are in animal products such as meats, butter, and cream. They can also be in plant products such as palm oil, palm kernel oil, and coconut oil.  Avoid foods with partially hydrogenated oils in them. These contain trans fats. Examples of foods that have trans fats are stick margarine, some tub margarines, cookies, crackers, and other baked goods. What general guidelines do I need to follow?  Check food labels. Look for the words "trans fat" and "saturated fat."  When preparing a meal: ? Fill half of your plate with vegetables and green salads. ? Fill one fourth of your plate with whole grains. Look for the word "whole" as the first word in the ingredient list. ? Fill one fourth of your plate with lean protein foods.  Eat more foods that have fiber, like apples, carrots, beans, peas, and barley.  Eat more home-cooked foods. Eat less at restaurants and buffets.  Limit or avoid alcohol.  Limit foods high in starch and sugar.  Limit fried foods.  Cook foods without frying them. Baking, boiling, grilling, and broiling are all great options.  Lose weight if you are overweight. Losing even a small amount of weight can help your overall health. It can also help prevent diseases such as diabetes and heart disease. What foods can I eat? Grains Whole grains, such as whole wheat or whole grain breads, crackers, cereals, and pasta. Unsweetened oatmeal,  bulgur, barley, quinoa, or brown rice. Corn or whole wheat flour tortillas. Vegetables Fresh or frozen vegetables (raw, steamed, roasted, or grilled). Green salads. Fruits All fresh, canned (in natural juice), or frozen fruits. Meat and Other Protein Products Ground beef (85% or leaner), grass-fed beef, or beef trimmed of fat. Skinless chicken or turkey. Ground chicken or turkey. Pork trimmed of fat. All fish and seafood. Eggs. Dried beans, peas, or lentils. Unsalted nuts or seeds. Unsalted canned or dry beans. Dairy Low-fat dairy products, such as skim or 1% milk, 2% or reduced-fat cheeses, low-fat ricotta or cottage cheese, or plain low-fat yogurt. Fats and Oils Tub margarines without trans fats. Light or reduced-fat mayonnaise and salad dressings. Avocado. Olive, canola, sesame, or safflower oils. Natural peanut or almond butter (choose ones without added sugar and oil). The items listed above may not be a complete list of recommended foods or beverages. Contact your dietitian for more options. What foods are not recommended? Grains White bread. White pasta. White rice. Cornbread. Bagels, pastries, and croissants. Crackers that contain trans fat. Vegetables White potatoes. Corn. Creamed or fried vegetables. Vegetables in a cheese sauce. Fruits Dried fruits. Canned fruit in light or heavy syrup. Fruit juice. Meat and Other Protein Products Fatty cuts of meat. Ribs, chicken wings, bacon, sausage, bologna, salami, chitterlings, fatback, hot dogs, bratwurst, and packaged luncheon meats. Liver and organ meats. Dairy Whole or 2% milk, cream, half-and-half, and cream cheese. Whole milk cheeses. Whole-fat or sweetened yogurt. Full-fat cheeses. Nondairy creamers and whipped toppings. Processed cheese, cheese spreads, or cheese curds. Sweets and Desserts Corn   syrup, sugars, honey, and molasses. Candy. Jam and jelly. Syrup. Sweetened cereals. Cookies, pies, cakes, donuts, muffins, and ice  cream. Fats and Oils Butter, stick margarine, lard, shortening, ghee, or bacon fat. Coconut, palm kernel, or palm oils. Beverages Alcohol. Sweetened drinks (such as sodas, lemonade, and fruit drinks or punches). The items listed above may not be a complete list of foods and beverages to avoid. Contact your dietitian for more information. This information is not intended to replace advice given to you by your health care provider. Make sure you discuss any questions you have with your health care provider. Document Released: 04/07/2012 Document Revised: 06/13/2016 Document Reviewed: 01/06/2014 Elsevier Interactive Patient Education  2018 [employer].     IF you received an x-ray today, you will receive an invoice from [facility]. Please contact [facility] at [phone] with questions or concerns regarding your invoice.   IF you received labwork today, you will receive an invoice from [facility]. Please contact [facility] at [phone] with questions or concerns regarding your invoice.   Our billing staff will not be able to assist you with questions regarding bills from these companies.  You will be contacted with the lab results as soon as they are available. The fastest way to get your results is to activate your My Chart account. Instructions are located on the last page of this paperwork. If you have not heard from [place] regarding the results in 2 weeks, please contact this office.

## 2017-05-06 NOTE — Progress Notes (Signed)
Subjective:    Patient ID: Regina Evans, female    DOB: 13-Apr-1941, 76 y.o.   MRN: [id number]  05/06/2017  Hypertension (6 month follow-up); Hyperlipidemia; and Hypothyroidism   HPI This 76 y.o. female presents for six month follow-up of hypertension, hypercholesterolemia, anxiety/depression.  No changes to management made at last visit.  Home readings running well.  Rarely checking.  Not exercising.    S/p cataract surgery; seeing really well; still wearing readers.    BP Readings from Last 3 Encounters:  05/06/17 137/77  10/23/16 (!) 144/86  04/09/16 123/75   Wt Readings from Last 3 Encounters:  05/06/17 164 lb 6.4 oz (74.6 kg)  10/23/16 171 lb (77.6 kg)  04/09/16 166 lb 6.4 oz (75.5 kg)   Immunization History  Administered Date(s) Administered  . Influenza Split 07/24/2010, 07/10/2011, 07/13/2012  . Influenza,inj,Quad PF,36+ Mos 08/11/2013  . Influenza-Unspecified 05/27/2014, 08/30/2015, 08/29/2016  . Pneumococcal Conjugate-13 09/07/2014  . Pneumococcal-Unspecified 11/05/2010  . Tdap 07/10/2011  . Zoster 08/26/2013      Review of Systems  Constitutional: Negative for chills, diaphoresis, fatigue and fever.  Eyes: Negative for visual disturbance.  Respiratory: Negative for cough and shortness of breath.   Cardiovascular: Negative for chest pain, palpitations and leg swelling.  Gastrointestinal: Negative for abdominal pain, constipation, diarrhea, nausea and vomiting.  Endocrine: Negative for cold intolerance, heat intolerance, polydipsia, polyphagia and polyuria.  Neurological: Negative for dizziness, tremors, seizures, syncope, facial asymmetry, speech difficulty, weakness, light-headedness, numbness and headaches.    Past Medical History:  Diagnosis Date  . Arthritis   . Cataract   . Depression   . Diverticulosis    no polyps in 2012 repeat colonoscopy in 5 years  . Essential hypertension, benign   . GERD (gastroesophageal reflux disease)   .  Headache(784.0)    none since hysterectomy  . Osteoarthritis    hands  . Osteopenia   . Other anxiety states   . Pure hypercholesterolemia   . Unspecified glaucoma(365.9)   . Unspecified hypothyroidism    Past Surgical History:  Procedure Laterality Date  . EYE SURGERY    . maxillary and mandibular surgery  1991  . TONSILLECTOMY  1949  . TOTAL ABDOMINAL HYSTERECTOMY  1991   ovaries removed;fibroids  . TUBAL LIGATION    . tube in right ear  2010   Allergies  Allergen Reactions  . Cefdinir     Rash    Social History   Social History  . Marital status: Married    Spouse name: N/A  . Number of children: 2  . Years of education: N/A   Occupational History  . retired    Social History Main Topics  . Smoking status: Never Smoker  . Smokeless tobacco: Never Used  . Alcohol use Yes     Comment: moderate 5-6 drinks per week (1-2 drinks/wine or liquor each night  . Drug use: No  . Sexual activity: Yes    Birth control/ protection: None     Comment: number of sex partners in the last 67 months  1   Other Topics Concern  . Not on file   Social History Narrative   Marital status:  Married x 54 years.       Children 2 daughters (49, 62); no grandchildren.      Lives:  With husband. Has a beach house.      Employment:  Retired.      Tobacco:  none      Alcohol:  Wine  or mixed drink; 1-3 drinks five days per week.     Caffeine use 3 servings per day.       Exercise: Inactive sporadic walking.  Walking some; 2-3 times per week.      Guns in the home stored in locked cabinet. Always uses seat belts.      ADLs; independent with ADLs; no assistant devices.      Advanced Directives: yes; FULL CODE but no prolonged measures.    Family History  Problem Relation Age of Onset  . Hypertension Mother   . Alzheimer's disease Mother        Hospice care  . Macular degeneration Mother   . Colon polyps Mother   . Cancer Father        prostate  . Diabetes Brother   .  Alzheimer's disease Maternal Grandmother   . Cancer Paternal Grandmother   . Dementia Paternal Grandfather   . Cirrhosis Paternal Grandfather   . Breast cancer Neg Hx        Objective:    BP 137/77   Pulse 64   Temp 98 F (36.7 C) (Oral)   Resp 18   Ht 5' 3.39" (1.61 m)   Wt 164 lb 6.4 oz (74.6 kg)   SpO2 95%   BMI 28.77 kg/m  Physical Exam  Constitutional: She is oriented to person, place, and time. She appears well-developed and well-nourished. No distress.  HENT:  Head: Normocephalic and atraumatic.  Right Ear: External ear normal.  Left Ear: External ear normal.  Nose: Nose normal.  Mouth/Throat: Oropharynx is clear and moist.  Eyes: Pupils are equal, round, and reactive to light. Conjunctivae and EOM are normal.  Neck: Normal range of motion. Neck supple. Carotid bruit is not present. No thyromegaly present.  Cardiovascular: Normal rate, regular rhythm, normal heart sounds and intact distal pulses.  Exam reveals no gallop and no friction rub.   No murmur heard. Pulmonary/Chest: Effort normal and breath sounds normal. She has no wheezes. She has no rales.  Abdominal: Soft. Bowel sounds are normal. She exhibits no distension and no mass. There is no tenderness. There is no rebound and no guarding.  Lymphadenopathy:    She has no cervical adenopathy.  Neurological: She is alert and oriented to person, place, and time. No cranial nerve deficit.  Skin: Skin is warm and dry. No rash noted. She is not diaphoretic. No erythema. No pallor.  Psychiatric: She has a normal mood and affect. Her behavior is normal.   Depression screen [facility] 2/9 05/06/2017 10/23/2016 04/09/2016 09/22/2015 03/15/2015  Decreased Interest 0 0 0 0 0  Down, Depressed, Hopeless 0 0 0 0 0  PHQ - 2 Score 0 0 0 0 0   Fall Risk  05/06/2017 10/23/2016 04/09/2016 09/22/2015 09/07/2014  Falls in the past year? No No No No Yes  Number falls in past yr: - - - - 2 or more  Injury with Fall? - - - - -        Assessment  & Plan:   1. Essential hypertension, benign   2. Seasonal allergic rhinitis due to pollen   3. Gastroesophageal reflux disease without esophagitis   4. Hypothyroidism due to acquired atrophy of thyroid   5. Depression with anxiety   6. Pure hypercholesterolemia   7. Glucose intolerance   8. Encounter for screening mammogram for breast cancer   9. Estrogen deficiency   10. Need for shingles vaccine    -controlled; obtain labs; continue medications. -  referral for bone density and mammogram.   Orders Placed This Encounter  Procedures  . MM SCREENING BREAST TOMO BILATERAL    Standing Status:   Future    Standing Expiration Date:   07/07/2018    Order Specific Question:   Reason for Exam (SYMPTOM  OR DIAGNOSIS REQUIRED)    Answer:   annual screening    Order Specific Question:   Preferred imaging location?    Answer:   [facility]  . DG Bone Density    Standing Status:   Future    Standing Expiration Date:   07/07/2018    Order Specific Question:   Reason for Exam (SYMPTOM  OR DIAGNOSIS REQUIRED)    Answer:   estrogen deficiency    Order Specific Question:   Preferred imaging location?    Answer:   [facility]  . CBC with Differential/Platelet  . Comprehensive metabolic panel    Order Specific Question:   Has the patient fasted?    Answer:   Yes  . Lipid panel    Order Specific Question:   Has the patient fasted?    Answer:   Yes  . TSH  . T4, free  . Hemoglobin A1c   Meds ordered this encounter  Medications  . Multiple Vitamins-Minerals (PRESERVISION AREDS 2+MULTI VIT PO)    Sig: Take by mouth.  . Zoster Vac Recomb Adjuvanted [facility]) injection    Sig: Inject 0.5 mLs into the muscle once.    Dispense:  0.5 mL    Refill:  1  . ALPRAZolam (XANAX) 0.5 MG tablet    Sig: Take 1 tablet (0.5 mg total) by mouth at bedtime as needed.    Dispense:  30 tablet    Refill:  2  . lovastatin (MEVACOR) 20 MG tablet    Sig: Take 1 tablet (20 mg total) by mouth at  bedtime.    Dispense:  90 tablet    Refill:  3  . losartan (COZAAR) 100 MG tablet    Sig: Take 1 tablet (100 mg total) by mouth daily.    Dispense:  90 tablet    Refill:  3    Return in about 6 months (around 11/06/2017) for complete physical examiniation.   Kristi Elayne Guerin, M.D. Primary Care at [facility] [address] [place], [place]  [phone] phone [phone] fax

## 2017-05-07 LAB — CBC WITH DIFFERENTIAL/PLATELET
BASOS ABS: 0.1 10*3/uL (ref 0.0–0.2)
Basos: 1 %
EOS (ABSOLUTE): 0.3 10*3/uL (ref 0.0–0.4)
Eos: 3 %
HEMOGLOBIN: 14.2 g/dL (ref 11.1–15.9)
Hematocrit: 43 % (ref 34.0–46.6)
IMMATURE GRANS (ABS): 0 10*3/uL (ref 0.0–0.1)
IMMATURE GRANULOCYTES: 0 %
LYMPHS: 33 %
Lymphocytes Absolute: 2.5 10*3/uL (ref 0.7–3.1)
MCH: 30.1 pg (ref 26.6–33.0)
MCHC: 33 g/dL (ref 31.5–35.7)
MCV: 91 fL (ref 79–97)
MONOCYTES: 5 %
Monocytes Absolute: 0.4 10*3/uL (ref 0.1–0.9)
NEUTROS ABS: 4.4 10*3/uL (ref 1.4–7.0)
NEUTROS PCT: 58 %
PLATELETS: 281 10*3/uL (ref 150–379)
RBC: 4.72 x10E6/uL (ref 3.77–5.28)
RDW: 14.7 % (ref 12.3–15.4)
WBC: 7.7 10*3/uL (ref 3.4–10.8)

## 2017-05-07 LAB — LIPID PANEL
CHOL/HDL RATIO: 3.8 ratio (ref 0.0–4.4)
Cholesterol, Total: 165 mg/dL (ref 100–199)
HDL: 43 mg/dL (ref >39)
LDL CALC: 82 mg/dL (ref 0–99)
Triglycerides: 199 mg/dL — ABNORMAL HIGH (ref 0–149)
VLDL CHOLESTEROL CAL: 40 mg/dL (ref 5–40)

## 2017-05-07 LAB — COMPREHENSIVE METABOLIC PANEL
ALBUMIN: 4.4 g/dL (ref 3.5–4.8)
ALT: 21 IU/L (ref 0–32)
AST: 19 IU/L (ref 0–40)
Albumin/Globulin Ratio: 1.6 (ref 1.2–2.2)
Alkaline Phosphatase: 115 IU/L (ref 39–117)
BILIRUBIN TOTAL: 0.3 mg/dL (ref 0.0–1.2)
BUN / CREAT RATIO: 12 (ref 12–28)
BUN: 9 mg/dL (ref 8–27)
CALCIUM: 9.9 mg/dL (ref 8.7–10.3)
CHLORIDE: 104 mmol/L (ref 96–106)
CO2: 23 mmol/L (ref 20–29)
CREATININE: 0.78 mg/dL (ref 0.57–1.00)
GFR calc Af Amer: 87 mL/min/{1.73_m2} (ref >59)
GFR, EST NON AFRICAN AMERICAN: 76 mL/min/{1.73_m2} (ref >59)
GLUCOSE: 96 mg/dL (ref 65–99)
Globulin, Total: 2.8 g/dL (ref 1.5–4.5)
Potassium: 5 mmol/L (ref 3.5–5.2)
Sodium: 142 mmol/L (ref 134–144)
Total Protein: 7.2 g/dL (ref 6.0–8.5)

## 2017-05-07 LAB — T4, FREE: FREE T4: 1.52 ng/dL (ref 0.82–1.77)

## 2017-05-07 LAB — HEMOGLOBIN A1C
Est. average glucose Bld gHb Est-mCnc: 120 mg/dL
Hgb A1c MFr Bld: 5.8 % — ABNORMAL HIGH (ref 4.8–5.6)

## 2017-05-07 LAB — TSH: TSH: 0.078 u[IU]/mL — AB (ref 0.450–4.500)

## 2017-05-20 ENCOUNTER — Ambulatory Visit
Admission: RE | Admit: 2017-05-20 | Discharge: 2017-05-20 | Disposition: A | Discharge status: Home / Self Care | Payer: Medicare Other | Source: Ambulatory Visit | Attending: Family Medicine | Admitting: Family Medicine

## 2017-05-20 DIAGNOSIS — Z1231 Encounter for screening mammogram for malignant neoplasm of breast: Secondary | ICD-10-CM | POA: Diagnosis present

## 2017-05-20 DIAGNOSIS — E2839 Other primary ovarian failure: Secondary | ICD-10-CM | POA: Diagnosis present

## 2017-11-04 ENCOUNTER — Encounter: Payer: Medicare Other | Admitting: Family Medicine

## 2017-11-27 ENCOUNTER — Encounter: Payer: Self-pay | Admitting: Family Medicine

## 2017-12-08 ENCOUNTER — Encounter: Payer: Medicare Other | Admitting: Family Medicine

## 2017-12-08 ENCOUNTER — Ambulatory Visit: Payer: Medicare Other

## 2017-12-09 ENCOUNTER — Encounter: Payer: Medicare Other | Admitting: Family Medicine

## 2017-12-27 ENCOUNTER — Encounter: Payer: Self-pay | Admitting: Family Medicine

## 2018-01-13 ENCOUNTER — Ambulatory Visit: Payer: Medicare Other

## 2018-01-13 ENCOUNTER — Ambulatory Visit (INDEPENDENT_AMBULATORY_CARE_PROVIDER_SITE_OTHER): Payer: Medicare Other

## 2018-01-13 VITALS — BP 120/76 | HR 77 | Ht 63.0 in | Wt 169.4 lb

## 2018-01-13 DIAGNOSIS — Z0000 Encounter for general adult medical examination without abnormal findings: Secondary | ICD-10-CM

## 2018-01-13 NOTE — Patient Instructions (Addendum)
Regina Evans , Thank you for taking time to come for your Medicare Wellness Visit. I appreciate your ongoing commitment to your health goals. Please review the following plan we discussed and let me know if I can assist you in the future.   Screening recommendations/referrals: Colonoscopy: up to date, next due 12/17/2020 Mammogram: up to date, next due 05/21/2019 Bone Density: up to date, next due 05/21/2019  Recommended yearly ophthalmology/optometry visit for glaucoma screening and checkup Recommended yearly dental visit for hygiene and checkup  Vaccinations: Influenza vaccine: up to date Pneumococcal vaccine: up to date Tdap vaccine: up to date, next due 07/09/2021 Shingles vaccine: up to date    Advanced directives: Patient provided a copy today.  Conditions/risks identified: Try to lose around 39 lbs in the future.  Next appointment: 01/14/18 @ 10:40 am with Dr. Tamala Julian, next AWV in 1 year    Preventive Care 65 Years and Older, Female Preventive care refers to lifestyle choices and visits with your health care provider that can promote health and wellness. What does preventive care include?  A yearly physical exam. This is also called an annual well check.  Dental exams once or twice a year.  Routine eye exams. Ask your health care provider how often you should have your eyes checked.  Personal lifestyle choices, including:  Daily care of your teeth and gums.  Regular physical activity.  Eating a healthy diet.  Avoiding tobacco and drug use.  Limiting alcohol use.  Practicing safe sex.  Taking low-dose aspirin every day.  Taking vitamin and mineral supplements as recommended by your health care provider. What happens during an annual well check? The services and screenings done by your health care provider during your annual well check will depend on your age, overall health, lifestyle risk factors, and family history of disease. Counseling  Your health care provider  may ask you questions about your:  Alcohol use.  Tobacco use.  Drug use.  Emotional well-being.  Home and relationship well-being.  Sexual activity.  Eating habits.  History of falls.  Memory and ability to understand (cognition).  Work and work Statistician.  Reproductive health. Screening  You may have the following tests or measurements:  Height, weight, and BMI.  Blood pressure.  Lipid and cholesterol levels. These may be checked every 5 years, or more frequently if you are over 64 years old.  Skin check.  Lung cancer screening. You may have this screening every year starting at age 73 if you have a 30-pack-year history of smoking and currently smoke or have quit within the past 15 years.  Fecal occult blood test (FOBT) of the stool. You may have this test every year starting at age 42.  Flexible sigmoidoscopy or colonoscopy. You may have a sigmoidoscopy every 5 years or a colonoscopy every 10 years starting at age 72.  Hepatitis C blood test.  Hepatitis B blood test.  Sexually transmitted disease (STD) testing.  Diabetes screening. This is done by checking your blood sugar (glucose) after you have not eaten for a while (fasting). You may have this done every 1-3 years.  Bone density scan. This is done to screen for osteoporosis. You may have this done starting at age 25.  Mammogram. This may be done every 1-2 years. Talk to your health care provider about how often you should have regular mammograms. Talk with your health care provider about your test results, treatment options, and if necessary, the need for more tests. Vaccines  Your health  care provider may recommend certain vaccines, such as:  Influenza vaccine. This is recommended every year.  Tetanus, diphtheria, and acellular pertussis (Tdap, Td) vaccine. You may need a Td booster every 10 years.  Zoster vaccine. You may need this after age 42.  Pneumococcal 13-valent conjugate (PCV13) vaccine.  One dose is recommended after age 70.  Pneumococcal polysaccharide (PPSV23) vaccine. One dose is recommended after age 87. Talk to your health care provider about which screenings and vaccines you need and how often you need them. This information is not intended to replace advice given to you by your health care provider. Make sure you discuss any questions you have with your health care provider. Document Released: 11/03/2015 Document Revised: 06/26/2016 Document Reviewed: 08/08/2015 Elsevier Interactive Patient Education  2017 [employer] Prevention in the Home Falls can cause injuries. They can happen to people of all ages. There are many things you can do to make your home safe and to help prevent falls. What can I do on the outside of my home?  Regularly fix the edges of walkways and driveways and fix any cracks.  Remove anything that might make you trip as you walk through a door, such as a raised step or threshold.  Trim any bushes or trees on the path to your home.  Use bright outdoor lighting.  Clear any walking paths of anything that might make someone trip, such as rocks or tools.  Regularly check to see if handrails are loose or broken. Make sure that both sides of any steps have handrails.  Any raised decks and porches should have guardrails on the edges.  Have any leaves, snow, or ice cleared regularly.  Use sand or salt on walking paths during winter.  Clean up any spills in your garage right away. This includes oil or grease spills. What can I do in the bathroom?  Use night lights.  Install grab bars by the toilet and in the tub and shower. Do not use towel bars as grab bars.  Use non-skid mats or decals in the tub or shower.  If you need to sit down in the shower, use a plastic, non-slip stool.  Keep the floor dry. Clean up any water that spills on the floor as soon as it happens.  Remove soap buildup in the tub or shower regularly.  Attach bath  mats securely with double-sided non-slip rug tape.  Do not have throw rugs and other things on the floor that can make you trip. What can I do in the bedroom?  Use night lights.  Make sure that you have a light by your bed that is easy to reach.  Do not use any sheets or blankets that are too big for your bed. They should not hang down onto the floor.  Have a firm chair that has side arms. You can use this for support while you get dressed.  Do not have throw rugs and other things on the floor that can make you trip. What can I do in the kitchen?  Clean up any spills right away.  Avoid walking on wet floors.  Keep items that you use a lot in easy-to-reach places.  If you need to reach something above you, use a strong step stool that has a grab bar.  Keep electrical cords out of the way.  Do not use floor polish or wax that makes floors slippery. If you must use wax, use non-skid floor wax.  Do not have  throw rugs and other things on the floor that can make you trip. What can I do with my stairs?  Do not leave any items on the stairs.  Make sure that there are handrails on both sides of the stairs and use them. Fix handrails that are broken or loose. Make sure that handrails are as long as the stairways.  Check any carpeting to make sure that it is firmly attached to the stairs. Fix any carpet that is loose or worn.  Avoid having throw rugs at the top or bottom of the stairs. If you do have throw rugs, attach them to the floor with carpet tape.  Make sure that you have a light switch at the top of the stairs and the bottom of the stairs. If you do not have them, ask someone to add them for you. What else can I do to help prevent falls?  Wear shoes that:  Do not have high heels.  Have rubber bottoms.  Are comfortable and fit you well.  Are closed at the toe. Do not wear sandals.  If you use a stepladder:  Make sure that it is fully opened. Do not climb a closed  stepladder.  Make sure that both sides of the stepladder are locked into place.  Ask someone to hold it for you, if possible.  Clearly mark and make sure that you can see:  Any grab bars or handrails.  First and last steps.  Where the edge of each step is.  Use tools that help you move around (mobility aids) if they are needed. These include:  Canes.  Walkers.  Scooters.  Crutches.  Turn on the lights when you go into a dark area. Replace any light bulbs as soon as they burn out.  Set up your furniture so you have a clear path. Avoid moving your furniture around.  If any of your floors are uneven, fix them.  If there are any pets around you, be aware of where they are.  Review your medicines with your doctor. Some medicines can make you feel dizzy. This can increase your chance of falling. Ask your doctor what other things that you can do to help prevent falls. This information is not intended to replace advice given to you by your health care provider. Make sure you discuss any questions you have with your health care provider. Document Released: 08/03/2009 Document Revised: 03/14/2016 Document Reviewed: 11/11/2014 Elsevier Interactive Patient Education  2017 [employer].

## 2018-01-13 NOTE — Progress Notes (Signed)
Subjective:   Regina Evans is a 77 y.o. female who presents for Medicare Annual (Subsequent) preventive examination.  Review of Systems:  N/A Cardiac Risk Factors include: advanced age (>68men, >65 women);dyslipidemia;hypertension;obesity (BMI >30kg/m2)     Objective:     Vitals: BP 120/76   Pulse 77   Ht 5\' 3"  (1.6 m)   Wt 169 lb 6 oz (76.8 kg)   SpO2 95%   BMI 30.00 kg/m   Body mass index is 30 kg/m.  Advanced Directives 01/13/2018 10/23/2016 10/19/2015 09/07/2014  Does Patient Have a Medical Advance Directive? Yes Yes Yes Yes  Type of Paramedic of [place];Living will Living will [place];Living will [place];Living will  Does patient want to make changes to medical advance directive? - - No - Patient declined No - Patient declined  Copy of [place] in Chart? Yes - Yes No - copy requested    Tobacco Social History   Tobacco Use  Smoking Status Never Smoker  Smokeless Tobacco Never Used     Counseling given: Not Answered   Clinical Intake:  Pre-visit preparation completed: Yes  Pain : No/denies pain     Nutritional Status: BMI > 30  Obese Nutritional Risks: None Diabetes: No  How often do you need to have someone help you when you read instructions, pamphlets, or other written materials from your doctor or pharmacy?: 1 - Never What is the last grade level you completed in school?: Bachelors college graduate   Interpreter Needed?: No  Information entered by :: [name], LPN  Past Medical History:  Diagnosis Date  . Arthritis   . Cataract   . Depression   . Diverticulosis    no polyps in 2012 repeat colonoscopy in 5 years  . Essential hypertension, benign   . GERD (gastroesophageal reflux disease)   . Headache(784.0)    none since hysterectomy  . Osteoarthritis    hands  . Osteopenia   . Other anxiety states   . Pure hypercholesterolemia   . Unspecified  glaucoma(365.9)   . Unspecified hypothyroidism    Past Surgical History:  Procedure Laterality Date  . EYE SURGERY    . maxillary and mandibular surgery  1991  . TONSILLECTOMY  1949  . TOTAL ABDOMINAL HYSTERECTOMY  1991   ovaries removed;fibroids  . TUBAL LIGATION    . tube in right ear  2010   Family History  Problem Relation Age of Onset  . Hypertension Mother   . Alzheimer's disease Mother        Hospice care  . Macular degeneration Mother   . Colon polyps Mother   . Cancer Father        prostate  . Diabetes Brother   . Alzheimer's disease Maternal Grandmother   . Cancer Paternal Grandmother   . Dementia Paternal Grandfather   . Cirrhosis Paternal Grandfather   . Breast cancer Neg Hx    Social History   Socioeconomic History  . Marital status: Married    Spouse name: Not on file  . Number of children: 2  . Years of education: Not on file  . Highest education level: Bachelor's degree (e.g., BA, AB, BS)  Occupational History  . Occupation: retired  Scientific laboratory technician  . Financial resource strain: Not hard at all  . Food insecurity:    Worry: Never true    Inability: Never true  . Transportation needs:    Medical: No  Non-medical: No  Tobacco Use  . Smoking status: Never Smoker  . Smokeless tobacco: Never Used  Substance and Sexual Activity  . Alcohol use: Yes    Comment: 2 drinks per week   . Drug use: No  . Sexual activity: Yes    Birth control/protection: None    Comment: number of sex partners in the last 12 months  1  Lifestyle  . Physical activity:    Days per week: 0 days    Minutes per session: 0 min  . Stress: To some extent  Relationships  . Social connections:    Talks on phone: More than three times a week    Gets together: More than three times a week    Attends religious service: Never    Active member of club or organization: No    Attends meetings of clubs or organizations: Never    Relationship status: Married  Other Topics Concern    . Not on file  Social History Narrative   Marital status:  Married x 54 years.       Children 2 daughters (49, 40); no grandchildren.      Lives:  With husband. Has a beach house.      Employment:  Retired.      Tobacco:  none      Alcohol:  Wine or mixed drink; 1-3 drinks five days per week.     Caffeine use 3 servings per day.       Exercise: Inactive sporadic walking.  Walking some; 2-3 times per week.      Guns in the home stored in locked cabinet. Always uses seat belts.      ADLs; independent with ADLs; no assistant devices.      Advanced Directives: yes; FULL CODE but no prolonged measures.     Outpatient Encounter Medications as of 01/13/2018  Medication Sig  . aspirin 81 MG tablet Take 81 mg by mouth daily.  . citalopram (CELEXA) 20 MG tablet Take 1 tablet (20 mg total) by mouth daily. (Patient taking differently: Take 10 mg by mouth daily. )  . levothyroxine (SYNTHROID, LEVOTHROID) 112 MCG tablet Take 1 tablet (112 mcg total) by mouth daily.  Marland Kitchen losartan (COZAAR) 100 MG tablet Take 1 tablet (100 mg total) by mouth daily.  Marland Kitchen lovastatin (MEVACOR) 20 MG tablet Take 1 tablet (20 mg total) by mouth at bedtime.  . Multiple Vitamins-Minerals (PRESERVISION AREDS 2+MULTI VIT PO) Take by mouth.  . timolol (BETIMOL) 0.5 % ophthalmic solution Place 1 drop into the left eye daily. USE QHS  . [DISCONTINUED] ALPRAZolam (XANAX) 0.5 MG tablet Take 1 tablet (0.5 mg total) by mouth at bedtime as needed.  . [DISCONTINUED] cetirizine (ZYRTEC) 10 MG tablet Take 10 mg by mouth daily. Reported on 04/09/2016  . [DISCONTINUED] fluticasone (FLONASE) 50 MCG/ACT nasal spray Place 2 sprays into both nostrils daily.   No facility-administered encounter medications on file as of 01/13/2018.     Activities of Daily Living In your present state of health, do you have any difficulty performing the following activities: 01/13/2018  Hearing? N  Vision? N  Difficulty concentrating or making decisions? N   Walking or climbing stairs? N  Dressing or bathing? N  Doing errands, shopping? N  Preparing Food and eating ? N  Using the Toilet? N  In the past six months, have you accidently leaked urine? Y  Comment Wears a pad occasionally   Do you have problems with loss of bowel control?  N  Managing your Medications? N  Managing your Finances? N  Housekeeping or managing your Housekeeping? N  Some recent data might be hidden    Patient Care Team: [name], MD as PCP - General (Family Medicine)    Assessment:   This is a routine wellness examination for Regina Evans.  Exercise Activities and Dietary recommendations Current Exercise Habits: The patient does not participate in regular exercise at present, Exercise limited by: None identified  Goals    . Weight (lb) < 130 lb (59 kg)     Patient states that she would like to try to lose around 39 lbs in the future.        Fall Risk Fall Risk  01/13/2018 05/06/2017 10/23/2016 04/09/2016 09/22/2015  Falls in the past year? No No No No No  Number falls in past yr: - - - - -  Injury with Fall? - - - - -   Is the patient's home free of loose throw rugs in walkways, pet beds, electrical cords, etc?   yes      Grab bars in the bathroom? no      Handrails on the stairs?   yes      Adequate lighting?   yes  Timed Get Up and Go performed: yes, completed within 30 seconds   Depression Screen PHQ 2/9 Scores 01/13/2018 05/06/2017 10/23/2016 04/09/2016  PHQ - 2 Score 0 0 0 0     Cognitive Function     6CIT Screen 01/13/2018  What Year? 0 points  What month? 0 points  What time? 0 points  Count back from 20 0 points  Months in reverse 0 points  Repeat phrase 0 points  Total Score 0    Immunization History  Administered Date(s) Administered  . Influenza Split 07/24/2010, 07/10/2011, 07/13/2012  . Influenza, High Dose Seasonal PF 07/23/2017  . Influenza,inj,Quad PF,6+ Mos 08/11/2013  . Influenza-Unspecified 05/27/2014, 08/30/2015, 08/29/2016   . Pneumococcal Conjugate-13 09/07/2014  . Pneumococcal-Unspecified 11/05/2010  . Tdap 07/10/2011  . Zoster 08/26/2013  . Zoster Recombinat (Shingrix) 12/27/2017    Qualifies for Shingles Vaccine: up to date   Screening Tests Health Maintenance  Topic Date Due  . MAMMOGRAM  05/21/2019  . COLONOSCOPY  12/17/2020  . TETANUS/TDAP  07/09/2021  . INFLUENZA VACCINE  Completed  . DEXA SCAN  Completed  . PNA vac Low Risk Adult  Completed    Cancer Screenings: Lung: Low Dose CT Chest recommended if Age 51-80 years, 30 pack-year currently smoking OR have quit w/in 15years. Patient does not qualify. Breast:  Up to date on Mammogram? Yes, completed 05/20/17 Up to date of Bone Density/Dexa? Yes, completed 05/20/17 Colorectal: colonoscopy completed 12/17/2010  Additional Screenings:  Hepatitis B/HIV/Syphillis: not indicated  Hepatitis C Screening: not indicated     Patient was non fasting today.  Plan:   I have personally reviewed and noted the following in the patient's chart:   . Medical and social history . Use of alcohol, tobacco or illicit drugs  . Current medications and supplements . Functional ability and status . Nutritional status . Physical activity . Advanced directives . List of other physicians . Hospitalizations, surgeries, and ER visits in previous 12 months . Vitals . Screenings to include cognitive, depression, and falls . Referrals and appointments  In addition, I have reviewed and discussed with patient certain preventive protocols, quality metrics, and best practice recommendations. A written personalized care plan for preventive services as well as general preventive health recommendations  were provided to patient. 1. Encounter for Medicare annual wellness exam   [name], LPN  [id number]

## 2018-01-14 ENCOUNTER — Other Ambulatory Visit: Payer: Self-pay

## 2018-01-14 ENCOUNTER — Ambulatory Visit: Payer: Medicare Other

## 2018-01-14 ENCOUNTER — Ambulatory Visit (INDEPENDENT_AMBULATORY_CARE_PROVIDER_SITE_OTHER): Payer: Medicare Other | Admitting: Family Medicine

## 2018-01-14 ENCOUNTER — Encounter: Payer: Self-pay | Admitting: Family Medicine

## 2018-01-14 VITALS — BP 132/82 | HR 83 | Temp 98.0°F | Resp 18 | Ht 63.0 in | Wt 169.0 lb

## 2018-01-14 DIAGNOSIS — Z0000 Encounter for general adult medical examination without abnormal findings: Secondary | ICD-10-CM

## 2018-01-14 DIAGNOSIS — F418 Other specified anxiety disorders: Secondary | ICD-10-CM

## 2018-01-14 DIAGNOSIS — E7800 Pure hypercholesterolemia, unspecified: Secondary | ICD-10-CM

## 2018-01-14 DIAGNOSIS — E034 Atrophy of thyroid (acquired): Secondary | ICD-10-CM | POA: Diagnosis not present

## 2018-01-14 DIAGNOSIS — IMO0002 Reserved for concepts with insufficient information to code with codable children: Secondary | ICD-10-CM

## 2018-01-14 DIAGNOSIS — I10 Essential (primary) hypertension: Secondary | ICD-10-CM

## 2018-01-14 DIAGNOSIS — N3941 Urge incontinence: Secondary | ICD-10-CM

## 2018-01-14 DIAGNOSIS — J301 Allergic rhinitis due to pollen: Secondary | ICD-10-CM

## 2018-01-14 DIAGNOSIS — C4492 Squamous cell carcinoma of skin, unspecified: Secondary | ICD-10-CM

## 2018-01-14 MED ORDER — LOVASTATIN 20 MG PO TABS: 20.0000 mg | ORAL_TABLET | Freq: Every day | ORAL | 3 refills | Status: DC

## 2018-01-14 MED ORDER — LOSARTAN POTASSIUM 100 MG PO TABS: 100.0000 mg | ORAL_TABLET | Freq: Every day | ORAL | 3 refills | Status: AC

## 2018-01-14 MED ORDER — CITALOPRAM HYDROBROMIDE 20 MG PO TABS: 20.0000 mg | ORAL_TABLET | Freq: Every day | ORAL | 3 refills | Status: AC

## 2018-01-14 MED ORDER — LEVOTHYROXINE SODIUM 112 MCG PO TABS: 112.0000 ug | ORAL_TABLET | Freq: Every day | ORAL | 3 refills | Status: DC

## 2018-01-14 NOTE — Progress Notes (Signed)
Subjective:    Patient ID: Regina Evans, female    DOB: 09-01-1941, 77 y.o.   MRN: [id number]  01/14/2018  Annual Exam    HPI This 77 y.o. female presents for COMPLETE PHYSICAL EXAMINATION.  Last physical:  AWV 2019 Pap smear:  n/a Mammogram:  04-2017 Colonoscopy:  2012; repeat in 2022. Bone density:  04-2017 Eye exam:  Every six months.  S/p cataract B.   Dental exam:  Every six months.  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  Annual visits.    HTN: Patient reports good compliance with medication, good tolerance to medication, and good symptom control.    Hypercholesterolemia: Patient reports good compliance with medication, good tolerance to medication, and good symptom control.    Hypothyroidism: Patient reports good compliance with medication, good tolerance to medication, and good symptom control.    Plantar fasciitis: suffered all winter; now improved.  Urinary incontinence: not ready for medication.    Visual Acuity Screening   Right eye Left eye Both eyes  Without correction:     With correction: 20/25 20/25 20/25     BP Readings from Last 3 Encounters:  01/14/18 132/82  01/13/18 120/76  05/06/17 137/77   Wt Readings from Last 3 Encounters:  01/14/18 169 lb (76.7 kg)  01/13/18 169 lb 6 oz (76.8 kg)  05/06/17 164 lb 6.4 oz (74.6 kg)   Immunization History  Administered Date(s) Administered  . Influenza Split 07/24/2010, 07/10/2011, 07/13/2012  . Influenza, High Dose Seasonal PF 07/23/2017  . Influenza,inj,Quad PF,6+ Mos 08/11/2013  . Influenza-Unspecified 05/27/2014, 08/30/2015, 08/29/2016  . Pneumococcal Conjugate-13 09/07/2014  . Pneumococcal-Unspecified 11/05/2010  . Tdap 07/10/2011  . Zoster 08/26/2013  . Zoster Recombinat (Shingrix) 12/27/2017   Health Maintenance  Topic Date Due  . INFLUENZA VACCINE  05/21/2018  . MAMMOGRAM  05/21/2019  . COLONOSCOPY  12/17/2020  . TETANUS/TDAP  07/09/2021  . DEXA SCAN  Completed  . PNA  vac Low Risk Adult  Completed   Review of Systems  Constitutional: Negative for activity change, appetite change, chills, diaphoresis, fatigue, fever and unexpected weight change.  HENT: Negative for congestion, dental problem, drooling, ear discharge, ear pain, facial swelling, hearing loss, mouth sores, nosebleeds, postnasal drip, rhinorrhea, sinus pressure, sneezing, sore throat, tinnitus, trouble swallowing and voice change.   Eyes: Negative for photophobia, pain, discharge, redness, itching and visual disturbance.  Respiratory: Negative for apnea, cough, choking, chest tightness, shortness of breath, wheezing and stridor.   Cardiovascular: Negative for chest pain, palpitations and leg swelling.  Gastrointestinal: Negative for abdominal distention, abdominal pain, anal bleeding, blood in stool, constipation, diarrhea, nausea, rectal pain and vomiting.  Endocrine: Negative for cold intolerance, heat intolerance, polydipsia, polyphagia and polyuria.  Genitourinary: Negative for decreased urine volume, difficulty urinating, dyspareunia, dysuria, enuresis, flank pain, frequency, genital sores, hematuria, menstrual problem, pelvic pain, urgency, vaginal bleeding, vaginal discharge and vaginal pain.       Nocturia x 0-1.  Urinary leakage moderate; mild worsening.  Musculoskeletal: Negative for arthralgias, back pain, gait problem, joint swelling, myalgias, neck pain and neck stiffness.  Skin: Negative for color change, pallor, rash and wound.  Allergic/Immunologic: Negative for environmental allergies, food allergies and immunocompromised state.  Neurological: Negative for dizziness, tremors, seizures, syncope, facial asymmetry, speech difficulty, weakness, light-headedness, numbness and headaches.  Hematological: Negative for adenopathy. Does not bruise/bleed easily.  Psychiatric/Behavioral: Negative for agitation, behavioral problems, confusion, decreased concentration, dysphoric mood,  hallucinations, self-injury, sleep disturbance and suicidal ideas. The patient is not nervous/anxious and  is not hyperactive.        Bedtime 9-11:30; wakes up 6:30-8:00.    Past Medical History:  Diagnosis Date  . Arthritis   . Cataract   . Depression   . Diverticulosis    no polyps in 2012 repeat colonoscopy in 5 years  . Essential hypertension, benign   . GERD (gastroesophageal reflux disease)   . Headache(784.0)    none since hysterectomy  . Osteoarthritis    hands  . Osteopenia   . Other anxiety states   . Pure hypercholesterolemia   . Unspecified glaucoma(365.9)   . Unspecified hypothyroidism    Past Surgical History:  Procedure Laterality Date  . EYE SURGERY    . maxillary and mandibular surgery  1991  . TONSILLECTOMY  1949  . TOTAL ABDOMINAL HYSTERECTOMY  1991   ovaries removed;fibroids  . TUBAL LIGATION    . tube in right ear  2010   Allergies  Allergen Reactions  . Cefdinir     Rash   Current Outpatient Medications on File Prior to Visit  Medication Sig Dispense Refill  . aspirin 81 MG tablet Take 81 mg by mouth daily.    . Multiple Vitamins-Minerals (PRESERVISION AREDS 2+MULTI VIT PO) Take by mouth.    . timolol (BETIMOL) 0.5 % ophthalmic solution Place 1 drop into the left eye daily. USE QHS     No current facility-administered medications on file prior to visit.    Social History   Socioeconomic History  . Marital status: Married    Spouse name: Not on file  . Number of children: 2  . Years of education: Not on file  . Highest education level: Bachelor's degree (e.g., BA, AB, BS)  Occupational History  . Occupation: retired  Scientific laboratory technician  . Financial resource strain: Not hard at all  . Food insecurity:    Worry: Never true    Inability: Never true  . Transportation needs:    Medical: No    Non-medical: No  Tobacco Use  . Smoking status: Never Smoker  . Smokeless tobacco: Never Used  Substance and Sexual Activity  . Alcohol use: Yes     Comment: 2 drinks per week   . Drug use: No  . Sexual activity: Yes    Birth control/protection: None    Comment: number of sex partners in the last 12 months  1  Lifestyle  . Physical activity:    Days per week: 0 days    Minutes per session: 0 min  . Stress: To some extent  Relationships  . Social connections:    Talks on phone: More than three times a week    Gets together: More than three times a week    Attends religious service: Never    Active member of club or organization: No    Attends meetings of clubs or organizations: Never    Relationship status: Married  . Intimate partner violence:    Fear of current or ex partner: No    Emotionally abused: No    Physically abused: No    Forced sexual activity: No  Other Topics Concern  . Not on file  Social History Narrative   Marital status:  Married x 55 years.       Children 2 daughters (106, 16); no grandchildren.      Lives:  With husband. Has a beach house.      Employment:  Retired.      Tobacco:  none  Alcohol:  Wine or mixed drink; 1-3 drinks five days per week.     Caffeine use 3 servings per day.       Exercise: Inactive sporadic walking.  Walking some; 2-3 times per week.      Guns in the home stored in locked cabinet. Always uses seat belts.      ADLs; independent with ADLs; no assistant devices.      Advanced Directives: yes; FULL CODE but no prolonged measures.    Family History  Problem Relation Age of Onset  . Hypertension Mother   . Alzheimer's disease Mother        Hospice care  . Macular degeneration Mother   . Colon polyps Mother   . Cancer Father        prostate  . Diabetes Brother   . Alzheimer's disease Maternal Grandmother   . Cancer Paternal Grandmother   . Dementia Paternal Grandfather   . Cirrhosis Paternal Grandfather   . Breast cancer Neg Hx        Objective:    BP 132/82   Pulse 83   Temp 98 F (36.7 C) (Oral)   Resp 18   Ht 5\' 3"  (1.6 m)   Wt 169 lb (76.7 kg)   SpO2  97%   BMI 29.94 kg/m  Physical Exam  Constitutional: She is oriented to person, place, and time. She appears well-developed and well-nourished. No distress.  HENT:  Head: Normocephalic and atraumatic.  Right Ear: Hearing, tympanic membrane, external ear and ear canal normal.  Left Ear: Hearing, tympanic membrane, external ear and ear canal normal.  Nose: Nose normal.  Mouth/Throat: Oropharynx is clear and moist.  Eyes: Pupils are equal, round, and reactive to light. Conjunctivae and EOM are normal.  Neck: Normal range of motion and full passive range of motion without pain. Neck supple. No JVD present. Carotid bruit is not present. No thyromegaly present.  Cardiovascular: Normal rate, regular rhythm, normal heart sounds and intact distal pulses. Exam reveals no gallop and no friction rub.  No murmur heard. Pulmonary/Chest: Effort normal and breath sounds normal. No respiratory distress. She has no wheezes. She has no rales. Right breast exhibits no inverted nipple, no mass, no nipple discharge, no skin change and no tenderness. Left breast exhibits no inverted nipple, no mass, no nipple discharge, no skin change and no tenderness. Breasts are symmetrical.  Abdominal: Soft. Bowel sounds are normal. She exhibits no distension and no mass. There is no tenderness. There is no rebound and no guarding.  Musculoskeletal:       Right shoulder: Normal.       Left shoulder: Normal.       Cervical back: Normal.  Lymphadenopathy:    She has no cervical adenopathy.  Neurological: She is alert and oriented to person, place, and time. She has normal reflexes. No cranial nerve deficit. She exhibits normal muscle tone. Coordination normal.  Skin: Skin is warm and dry. No rash noted. She is not diaphoretic. No erythema. No pallor.  Psychiatric: She has a normal mood and affect. Her behavior is normal. Judgment and thought content normal.  Nursing note and vitals reviewed.  No results found. Depression  screen [facility] 2/9 01/14/2018 01/13/2018 05/06/2017 10/23/2016 04/09/2016  Decreased Interest 0 0 0 0 0  Down, Depressed, Hopeless 0 0 0 0 0  PHQ - 2 Score 0 0 0 0 0   Fall Risk  01/14/2018 01/13/2018 05/06/2017 10/23/2016 04/09/2016  Falls in the past year? No  No No No No  Number falls in past yr: - - - - -  Injury with Fall? - - - - -        Assessment & Plan:   1. Routine physical examination   2. Essential hypertension, benign   3. Pure hypercholesterolemia   4. Hypothyroidism due to acquired atrophy of thyroid   5. Depression with anxiety   6. Seasonal allergic rhinitis due to pollen   7. Squamous cell carcinoma   8. Urge incontinence of urine    -anticipatory guidance provided --- exercise, weight loss, safe driving practices, aspirin 81mg  daily. -obtain age appropriate screening labs and labs for chronic disease management. -moderate fall risk; no evidence of depression; no evidence of hearing loss.  Discussed advanced directives and living will; also discussed end of life issues including code status.  -s/p dermatology consultation with diagnosis of squamous cell carcinoma.  Followed by dermatology annually. -Urge incontinence: New onset.  Recommend caffeine avoidance and pelvic floor strengthening exercises.  Patient declines medication at this time.   -Hypertension, hypercholesterolemia, hypothyroidism, depression anxiety, allergic rhinitis all well controlled.  Refills of medications provided without changes.    Orders Placed This Encounter  Procedures  . Comprehensive metabolic panel  . TSH  . Lipid panel  . Urinalysis, dipstick only  . CBC with Differential/Platelet  . T4, free   Meds ordered this encounter  Medications  . citalopram (CELEXA) 20 MG tablet    Sig: Take 1 tablet (20 mg total) by mouth daily.    Dispense:  90 tablet    Refill:  3  . levothyroxine (SYNTHROID, LEVOTHROID) 112 MCG tablet    Sig: Take 1 tablet (112 mcg total) by mouth daily.    Dispense:  90  tablet    Refill:  3  . losartan (COZAAR) 100 MG tablet    Sig: Take 1 tablet (100 mg total) by mouth daily.    Dispense:  90 tablet    Refill:  3  . lovastatin (MEVACOR) 20 MG tablet    Sig: Take 1 tablet (20 mg total) by mouth at bedtime.    Dispense:  90 tablet    Refill:  3    Return in about 6 months (around 07/17/2018) for follow-up chronic medical conditions.   Beola Vasallo Elayne Guerin, M.D. Primary Care at [facility] [address] [place], [place]  [address] [phone] phone [phone] fax

## 2018-01-14 NOTE — Patient Instructions (Addendum)
   IF you received an x-ray today, you will receive an invoice from [facility]. Please contact [facility] at [phone] with questions or concerns regarding your invoice.   IF you received labwork today, you will receive an invoice from [facility]. Please contact [facility] at [phone] with questions or concerns regarding your invoice.   Our billing staff will not be able to assist you with questions regarding bills from these companies.  You will be contacted with the lab results as soon as they are available. The fastest way to get your results is to activate your My Chart account. Instructions are located on the last page of this paperwork. If you have not heard from us regarding the results in 2 weeks, please contact this office.      Preventive Care 65 Years and Older, Female Preventive care refers to lifestyle choices and visits with your health care provider that can promote health and wellness. What does preventive care include?  A yearly physical exam. This is also called an annual well check.  Dental exams once or twice a year.  Routine eye exams. Ask your health care provider how often you should have your eyes checked.  Personal lifestyle choices, including: ? Daily care of your teeth and gums. ? Regular physical activity. ? Eating a healthy diet. ? Avoiding tobacco and drug use. ? Limiting alcohol use. ? Practicing safe sex. ? Taking low-dose aspirin every day. ? Taking vitamin and mineral supplements as recommended by your health care provider. What happens during an annual well check? The services and screenings done by your health care provider during your annual well check will depend on your age, overall health, lifestyle risk factors, and family history of disease. Counseling Your health care provider may ask you questions about your:  Alcohol use.  Tobacco use.  Drug use.  Emotional well-being.  Home and relationship  well-being.  Sexual activity.  Eating habits.  History of falls.  Memory and ability to understand (cognition).  Work and work environment.  Reproductive health.  Screening You may have the following tests or measurements:  Height, weight, and BMI.  Blood pressure.  Lipid and cholesterol levels. These may be checked every 5 years, or more frequently if you are over 50 years old.  Skin check.  Lung cancer screening. You may have this screening every year starting at age 55 if you have a 30-pack-year history of smoking and currently smoke or have quit within the past 15 years.  Fecal occult blood test (FOBT) of the stool. You may have this test every year starting at age 50.  Flexible sigmoidoscopy or colonoscopy. You may have a sigmoidoscopy every 5 years or a colonoscopy every 10 years starting at age 50.  Hepatitis C blood test.  Hepatitis B blood test.  Sexually transmitted disease (STD) testing.  Diabetes screening. This is done by checking your blood sugar (glucose) after you have not eaten for a while (fasting). You may have this done every 1-3 years.  Bone density scan. This is done to screen for osteoporosis. You may have this done starting at age 75.  Mammogram. This may be done every 1-2 years. Talk to your health care provider about how often you should have regular mammograms.  Talk with your health care provider about your test results, treatment options, and if necessary, the need for more tests. Vaccines Your health care provider may recommend certain vaccines, such as:  Influenza vaccine. This is recommended every year.    Tetanus, diphtheria, and acellular pertussis (Tdap, Td) vaccine. You may need a Td booster every 10 years.  Varicella vaccine. You may need this if you have not been vaccinated.  Zoster vaccine. You may need this after age 60.  Measles, mumps, and rubella (MMR) vaccine. You may need at least one dose of MMR if you were born in  1957 or later. You may also need a second dose.  Pneumococcal 13-valent conjugate (PCV13) vaccine. One dose is recommended after age 75.  Pneumococcal polysaccharide (PPSV23) vaccine. One dose is recommended after age 75.  Meningococcal vaccine. You may need this if you have certain conditions.  Hepatitis A vaccine. You may need this if you have certain conditions or if you travel or work in places where you may be exposed to hepatitis A.  Hepatitis B vaccine. You may need this if you have certain conditions or if you travel or work in places where you may be exposed to hepatitis B.  Haemophilus influenzae type b (Hib) vaccine. You may need this if you have certain conditions.  Talk to your health care provider about which screenings and vaccines you need and how often you need them. This information is not intended to replace advice given to you by your health care provider. Make sure you discuss any questions you have with your health care provider. Document Released: 11/03/2015 Document Revised: 06/26/2016 Document Reviewed: 08/08/2015 Elsevier Interactive Patient Education  2018 [employer]  

## 2018-01-15 LAB — CBC WITH DIFFERENTIAL/PLATELET
Basophils Absolute: 0.1 10*3/uL (ref 0.0–0.2)
Basos: 1 %
EOS (ABSOLUTE): 0.3 10*3/uL (ref 0.0–0.4)
EOS: 3 %
HEMATOCRIT: 44.2 % (ref 34.0–46.6)
Hemoglobin: 14.6 g/dL (ref 11.1–15.9)
IMMATURE GRANULOCYTES: 0 %
Immature Grans (Abs): 0 10*3/uL (ref 0.0–0.1)
Lymphocytes Absolute: 2.8 10*3/uL (ref 0.7–3.1)
Lymphs: 31 %
MCH: 29.9 pg (ref 26.6–33.0)
MCHC: 33 g/dL (ref 31.5–35.7)
MCV: 91 fL (ref 79–97)
MONOCYTES: 5 %
Monocytes Absolute: 0.4 10*3/uL (ref 0.1–0.9)
NEUTROS PCT: 60 %
Neutrophils Absolute: 5.4 10*3/uL (ref 1.4–7.0)
Platelets: 279 10*3/uL (ref 150–379)
RBC: 4.88 x10E6/uL (ref 3.77–5.28)
RDW: 14.4 % (ref 12.3–15.4)
WBC: 9 10*3/uL (ref 3.4–10.8)

## 2018-01-15 LAB — URINALYSIS, DIPSTICK ONLY
BILIRUBIN UA: NEGATIVE
GLUCOSE, UA: NEGATIVE
Ketones, UA: NEGATIVE
Nitrite, UA: NEGATIVE
PH UA: 5.5 (ref 5.0–7.5)
Protein, UA: NEGATIVE
RBC, UA: NEGATIVE
Specific Gravity, UA: 1.02 (ref 1.005–1.030)
UUROB: 0.2 mg/dL (ref 0.2–1.0)

## 2018-01-15 LAB — COMPREHENSIVE METABOLIC PANEL
A/G RATIO: 1.7 (ref 1.2–2.2)
ALK PHOS: 114 IU/L (ref 39–117)
ALT: 20 IU/L (ref 0–32)
AST: 18 IU/L (ref 0–40)
Albumin: 4.5 g/dL (ref 3.5–4.8)
BUN/Creatinine Ratio: 14 (ref 12–28)
BUN: 11 mg/dL (ref 8–27)
Bilirubin Total: 0.2 mg/dL (ref 0.0–1.2)
CO2: 20 mmol/L (ref 20–29)
Calcium: 9.8 mg/dL (ref 8.7–10.3)
Chloride: 105 mmol/L (ref 96–106)
Creatinine, Ser: 0.78 mg/dL (ref 0.57–1.00)
GFR calc Af Amer: 87 mL/min/{1.73_m2} (ref >59)
GFR calc non Af Amer: 75 mL/min/{1.73_m2} (ref >59)
GLOBULIN, TOTAL: 2.7 g/dL (ref 1.5–4.5)
Glucose: 84 mg/dL (ref 65–99)
POTASSIUM: 4.6 mmol/L (ref 3.5–5.2)
SODIUM: 143 mmol/L (ref 134–144)
Total Protein: 7.2 g/dL (ref 6.0–8.5)

## 2018-01-15 LAB — LIPID PANEL
CHOLESTEROL TOTAL: 167 mg/dL (ref 100–199)
Chol/HDL Ratio: 3.6 ratio (ref 0.0–4.4)
HDL: 47 mg/dL (ref >39)
LDL Calculated: 82 mg/dL (ref 0–99)
Triglycerides: 188 mg/dL — ABNORMAL HIGH (ref 0–149)
VLDL Cholesterol Cal: 38 mg/dL (ref 5–40)

## 2018-01-15 LAB — TSH: TSH: 4.28 u[IU]/mL (ref 0.450–4.500)

## 2018-01-15 LAB — T4, FREE: FREE T4: 1.17 ng/dL (ref 0.82–1.77)

## 2018-03-16 DIAGNOSIS — IMO0002 Reserved for concepts with insufficient information to code with codable children: Secondary | ICD-10-CM | POA: Insufficient documentation

## 2018-03-16 DIAGNOSIS — N3941 Urge incontinence: Secondary | ICD-10-CM | POA: Insufficient documentation

## 2018-03-18 ENCOUNTER — Encounter: Payer: Self-pay | Admitting: Family Medicine

## 2018-04-15 NOTE — Progress Notes (Signed)
Subjective:    Patient ID: Regina Evans, female    DOB: [birth year], 77 y.o.   MRN: [id number]  04/21/2018  Constipation (issue with constipation x 1 month, cramping, gas )    HPI This 77 y.o. female presents for evaluation of constipation, cramping, gas.  Onset one month ago.  Excessive amount of gas. Next week having thin stools with mucous. Had temperature; chills/sweats/achy; at beach. Felt badly. Cramping in abdomen. Had back pain associated with abdominal pain. Tried prunes and prune juice. Towards end of week tried suppository. Drinking prunes. Had pink rectal discharge. Tried Metamucil. Ate more spinach. Dulcolax. Had diarrhea. Then had infrequent hard stools. Last week, feeling some better.  No longer having cramping. Still had thin stools.   Yesterday had diarrhea yet feel stress related.  Felt needed follow-up.   Fevers occurred the third week.   Abdominal pain and back pain are resolved. Had friend die from SBO. Friend diagnosed with diverticulitis.   12/02/2010 underwent colonoscopy [facility] with internal hemorrhoids and polyps.  No diverticulosis.  No recent foreign travel; no recent antibiotics. No sick contacts.  Had been changing diet; trying to eat better.   BP Readings from Last 3 Encounters:  04/21/18 114/68  01/14/18 132/82  01/13/18 120/76   Wt Readings from Last 3 Encounters:  04/21/18 162 lb 6.4 oz (73.7 kg)  01/14/18 169 lb (76.7 kg)  01/13/18 169 lb 6 oz (76.8 kg)   Immunization History  Administered Date(s) Administered  . Influenza Split 07/24/2010, 07/10/2011, 07/13/2012  . Influenza, High Dose Seasonal PF 07/23/2017  . Influenza,inj,Quad PF,6+ Mos 08/11/2013  . Influenza-Unspecified 05/27/2014, 08/30/2015, 08/29/2016  . Pneumococcal Conjugate-13 09/07/2014  . Pneumococcal-Unspecified 11/05/2010  . Tdap 07/10/2011  . Zoster 08/26/2013  . Zoster Recombinat (Shingrix) 12/27/2017    Review of Systems  Constitutional: Positive for  fever. Negative for chills, diaphoresis and fatigue.  Eyes: Negative for visual disturbance.  Respiratory: Negative for cough and shortness of breath.   Cardiovascular: Negative for chest pain, palpitations and leg swelling.  Gastrointestinal: Positive for abdominal distention, anal bleeding, constipation and diarrhea. Negative for abdominal pain, blood in stool, nausea, rectal pain and vomiting.  Endocrine: Negative for cold intolerance, heat intolerance, polydipsia, polyphagia and polyuria.  Genitourinary: Negative for decreased urine volume, difficulty urinating, dysuria, flank pain, frequency, hematuria, pelvic pain, urgency, vaginal discharge and vaginal pain.  Musculoskeletal: Positive for back pain.  Neurological: Negative for dizziness, tremors, seizures, syncope, facial asymmetry, speech difficulty, weakness, light-headedness, numbness and headaches.    Past Medical History:  Diagnosis Date  . Arthritis   . Cataract   . Depression   . Diverticulosis    no polyps in 2012 repeat colonoscopy in 5 years  . Essential hypertension, benign   . GERD (gastroesophageal reflux disease)   . Headache(784.0)    none since hysterectomy  . Osteoarthritis    hands  . Osteopenia   . Other anxiety states   . Pure hypercholesterolemia   . Unspecified glaucoma(365.9)   . Unspecified hypothyroidism    Past Surgical History:  Procedure Laterality Date  . EYE SURGERY    . maxillary and mandibular surgery  1991  . TONSILLECTOMY  1949  . TOTAL ABDOMINAL HYSTERECTOMY  1991   ovaries removed;fibroids  . TUBAL LIGATION    . tube in right ear  2010   Allergies  Allergen Reactions  . Cefdinir     Rash   Current Outpatient Medications on File Prior to Visit  Medication Sig Dispense  Refill  . aspirin 81 MG tablet Take 81 mg by mouth daily.    Marland Kitchen levothyroxine (SYNTHROID, LEVOTHROID) 112 MCG tablet Take 1 tablet (112 mcg total) by mouth daily. 90 tablet 3  . losartan (COZAAR) 100 MG tablet  Take 1 tablet (100 mg total) by mouth daily. 90 tablet 3  . lovastatin (MEVACOR) 20 MG tablet Take 1 tablet (20 mg total) by mouth at bedtime. 90 tablet 3  . Multiple Vitamins-Minerals (PRESERVISION AREDS 2+MULTI VIT PO) Take by mouth.    . citalopram (CELEXA) 20 MG tablet Take 1 tablet (20 mg total) by mouth daily. (Patient not taking: Reported on 04/21/2018) 90 tablet 3  . timolol (BETIMOL) 0.5 % ophthalmic solution Place 1 drop into the left eye daily. USE QHS     No current facility-administered medications on file prior to visit.    Social History   Socioeconomic History  . Marital status: Married    Spouse name: Not on file  . Number of children: 2  . Years of education: Not on file  . Highest education level: Bachelor's degree (e.g., BA, AB, BS)  Occupational History  . Occupation: retired  Scientific laboratory technician  . Financial resource strain: Not hard at all  . Food insecurity:    Worry: Never true    Inability: Never true  . Transportation needs:    Medical: No    Non-medical: No  Tobacco Use  . Smoking status: Never Smoker  . Smokeless tobacco: Never Used  Substance and Sexual Activity  . Alcohol use: Yes    Comment: 2 drinks per week   . Drug use: No  . Sexual activity: Yes    Birth control/protection: None    Comment: number of sex partners in the last 12 months  1  Lifestyle  . Physical activity:    Days per week: 0 days    Minutes per session: 0 min  . Stress: To some extent  Relationships  . Social connections:    Talks on phone: More than three times a week    Gets together: More than three times a week    Attends religious service: Never    Active member of club or organization: No    Attends meetings of clubs or organizations: Never    Relationship status: Married  . Intimate partner violence:    Fear of current or ex partner: No    Emotionally abused: No    Physically abused: No    Forced sexual activity: No  Other Topics Concern  . Not on file  Social  History Narrative   Marital status:  Married x 55 years.       Children 2 daughters (51, 19); no grandchildren.      Lives:  With husband. Has a beach house.      Employment:  Retired.      Tobacco:  none      Alcohol:  Wine or mixed drink; 1-3 drinks five days per week.     Caffeine use 3 servings per day.       Exercise: Inactive sporadic walking.  Walking some; 2-3 times per week.      Guns in the home stored in locked cabinet. Always uses seat belts.      ADLs; independent with ADLs; no assistant devices.      Advanced Directives: yes; FULL CODE but no prolonged measures.    Family History  Problem Relation Age of Onset  . Hypertension Mother   .  Alzheimer's disease Mother        Hospice care  . Macular degeneration Mother   . Colon polyps Mother   . Cancer Father        prostate  . Diabetes Brother   . Alzheimer's disease Maternal Grandmother   . Cancer Paternal Grandmother   . Dementia Paternal Grandfather   . Cirrhosis Paternal Grandfather   . Breast cancer Neg Hx        Objective:    BP 114/68   Pulse 95   Temp 99.1 F (37.3 C)   Resp 16   Ht 5\' 3"  (1.6 m)   Wt 162 lb 6.4 oz (73.7 kg)   SpO2 95%   BMI 28.77 kg/m  Physical Exam  Constitutional: She is oriented to person, place, and time. She appears well-developed and well-nourished. No distress.  HENT:  Head: Normocephalic and atraumatic.  Right Ear: External ear normal.  Left Ear: External ear normal.  Nose: Nose normal.  Mouth/Throat: Oropharynx is clear and moist.  Eyes: Pupils are equal, round, and reactive to light. Conjunctivae and EOM are normal.  Neck: Normal range of motion. Neck supple. Carotid bruit is not present. No thyromegaly present.  Cardiovascular: Normal rate, regular rhythm, normal heart sounds and intact distal pulses. Exam reveals no gallop and no friction rub.  No murmur heard. Pulmonary/Chest: Effort normal and breath sounds normal. She has no wheezes. She has no rales.    Abdominal: Soft. Bowel sounds are normal. She exhibits no distension and no mass. There is no hepatosplenomegaly. There is generalized tenderness and tenderness in the right lower quadrant, suprapubic area and left lower quadrant. There is no rigidity, no rebound, no guarding and no CVA tenderness. No hernia.  Genitourinary: Rectum normal. Rectal exam shows no mass.  Genitourinary Comments: No rectal impaction.  Lymphadenopathy:    She has no cervical adenopathy.  Neurological: She is alert and oriented to person, place, and time. No cranial nerve deficit.  Skin: Skin is warm and dry. No rash noted. She is not diaphoretic. No erythema. No pallor.  Psychiatric: She has a normal mood and affect. Her behavior is normal.   No results found. Depression screen [facility] 2/9 04/21/2018 01/14/2018 01/13/2018 05/06/2017 10/23/2016  Decreased Interest 0 0 0 0 0  Down, Depressed, Hopeless 0 0 0 0 0  PHQ - 2 Score 0 0 0 0 0   Fall Risk  04/21/2018 01/14/2018 01/13/2018 05/06/2017 10/23/2016  Falls in the past year? No No No No No  Number falls in past yr: - - - - -  Injury with Fall? - - - - -        Assessment & Plan:   1. Mucous in stools   2. Slow transit constipation   3. Gastroesophageal reflux disease without esophagitis   4. Left lower quadrant pain     New onset constipation associated with left lower quadrant pain, fever, mucus in stools.  Much improved this week.  Duration of symptoms 4 weeks.  Due to improvement, will obtain lab work as well as urinalysis with urine culture.  Recommend ongoing treatment with daily Metamucil therapy, increased water intake, resuming regular diet as tolerated.  Return to clinic for acute worsening.  Differential diagnosis includes constipation idiopathic, acute diverticulitis which is improvement.  We will defer any imaging studies at this time due to clinical improvement.  Patient is agreeable with plan will update provider if symptoms worsen.  Will place referral to  gastroenterology in case  symptoms do not improve.  Patient can cancel consultation if improves.  Orders Placed This Encounter  Procedures  . Urine Culture    Order Specific Question:   Source    Answer:   ckeab catcg  . CBC with Differential/Platelet  . Comprehensive metabolic panel  . Sedimentation rate  . Amylase  . Lipase  . Ambulatory referral to Gastroenterology    Referral Priority:   Routine    Referral Type:   Consultation    Referral Reason:   Specialty Services Required    Referred to Provider:   Lucilla Lame, MD    Number of Visits Requested:   1  . POCT urinalysis dipstick   No orders of the defined types were placed in this encounter.   Return in about 2 weeks (around 05/05/2018) for recheck constipation.   Tamee Battin Elayne Guerin, M.D. Primary [facility] at [facility] [address] [place], [place]  [address] [phone] phone [phone] fax

## 2018-04-21 ENCOUNTER — Other Ambulatory Visit: Payer: Self-pay

## 2018-04-21 ENCOUNTER — Ambulatory Visit: Payer: Medicare Other | Admitting: Family Medicine

## 2018-04-21 ENCOUNTER — Encounter: Payer: Self-pay | Admitting: Family Medicine

## 2018-04-21 VITALS — BP 114/68 | HR 95 | Temp 99.1°F | Resp 16 | Ht 63.0 in | Wt 162.4 lb

## 2018-04-21 DIAGNOSIS — K219 Gastro-esophageal reflux disease without esophagitis: Secondary | ICD-10-CM

## 2018-04-21 DIAGNOSIS — R195 Other fecal abnormalities: Secondary | ICD-10-CM | POA: Diagnosis not present

## 2018-04-21 DIAGNOSIS — K5901 Slow transit constipation: Secondary | ICD-10-CM

## 2018-04-21 DIAGNOSIS — R1032 Left lower quadrant pain: Secondary | ICD-10-CM

## 2018-04-21 LAB — POCT URINALYSIS DIP (MANUAL ENTRY)
Bilirubin, UA: NEGATIVE
GLUCOSE UA: NEGATIVE mg/dL
Ketones, POC UA: NEGATIVE mg/dL
Leukocytes, UA: NEGATIVE
Nitrite, UA: NEGATIVE
Protein Ur, POC: NEGATIVE mg/dL
Urobilinogen, UA: 0.2 E.U./dL
pH, UA: 6 (ref 5.0–8.0)

## 2018-04-21 NOTE — Patient Instructions (Addendum)
Take Metamucil every day for two weeks. [phone] Reginia Forts cell phone    Constipation, Adult Constipation is when a person has fewer bowel movements in a week than normal, has difficulty having a bowel movement, or has stools that are dry, hard, or larger than normal. Constipation may be caused by an underlying condition. It may become worse with age if a person takes certain medicines and does not take in enough fluids. Follow these instructions at home: Eating and drinking   Eat foods that have a lot of fiber, such as fresh fruits and vegetables, whole grains, and beans.  Limit foods that are high in fat, low in fiber, or overly processed, such as french fries, hamburgers, cookies, candies, and soda.  Drink enough fluid to keep your urine clear or pale yellow. General instructions  Exercise regularly or as told by your health care provider.  Go to the restroom when you have the urge to go. Do not hold it in.  Take over-the-counter and prescription medicines only as told by your health care provider. These include any fiber supplements.  Practice pelvic floor retraining exercises, such as deep breathing while relaxing the lower abdomen and pelvic floor relaxation during bowel movements.  Watch your condition for any changes.  Keep all follow-up visits as told by your health care provider. This is important. Contact a health care provider if:  You have pain that gets worse.  You have a fever.  You do not have a bowel movement after 4 days.  You vomit.  You are not hungry.  You lose weight.  You are bleeding from the anus.  You have thin, pencil-like stools. Get help right away if:  You have a fever and your symptoms suddenly get worse.  You leak stool or have blood in your stool.  Your abdomen is bloated.  You have severe pain in your abdomen.  You feel dizzy or you faint. This information is not intended to replace advice given to you by your health  care provider. Make sure you discuss any questions you have with your health care provider. Document Released: 07/05/2004 Document Revised: 04/26/2016 Document Reviewed: 03/27/2016 Elsevier Interactive Patient Education  2018 [employer].   IF you received an x-ray today, you will receive an invoice from [facility]. Please contact [facility] at [phone] with questions or concerns regarding your invoice.   IF you received labwork today, you will receive an invoice from [facility]. Please contact [facility] at [phone] with questions or concerns regarding your invoice.   Our billing staff will not be able to assist you with questions regarding bills from these companies.  You will be contacted with the lab results as soon as they are available. The fastest way to get your results is to activate your My Chart account. Instructions are located on the last page of this paperwork. If you have not heard from [place] regarding the results in 2 weeks, please contact this office.

## 2018-04-22 LAB — COMPREHENSIVE METABOLIC PANEL
ALBUMIN: 4.3 g/dL (ref 3.5–4.8)
ALT: 17 IU/L (ref 0–32)
AST: 19 IU/L (ref 0–40)
Albumin/Globulin Ratio: 1.6 (ref 1.2–2.2)
Alkaline Phosphatase: 103 IU/L (ref 39–117)
BUN/Creatinine Ratio: 9 — ABNORMAL LOW (ref 12–28)
BUN: 9 mg/dL (ref 8–27)
Bilirubin Total: 0.4 mg/dL (ref 0.0–1.2)
CALCIUM: 9.6 mg/dL (ref 8.7–10.3)
CO2: 22 mmol/L (ref 20–29)
CREATININE: 1.02 mg/dL — AB (ref 0.57–1.00)
Chloride: 100 mmol/L (ref 96–106)
GFR calc Af Amer: 63 mL/min/{1.73_m2} (ref >59)
GFR, EST NON AFRICAN AMERICAN: 54 mL/min/{1.73_m2} — AB (ref >59)
GLOBULIN, TOTAL: 2.7 g/dL (ref 1.5–4.5)
Glucose: 92 mg/dL (ref 65–99)
Potassium: 4.6 mmol/L (ref 3.5–5.2)
SODIUM: 137 mmol/L (ref 134–144)
TOTAL PROTEIN: 7 g/dL (ref 6.0–8.5)

## 2018-04-22 LAB — CBC WITH DIFFERENTIAL/PLATELET
BASOS: 0 %
Basophils Absolute: 0 10*3/uL (ref 0.0–0.2)
EOS (ABSOLUTE): 0 10*3/uL (ref 0.0–0.4)
Eos: 0 %
Hematocrit: 42.1 % (ref 34.0–46.6)
Hemoglobin: 13.6 g/dL (ref 11.1–15.9)
IMMATURE GRANULOCYTES: 0 %
Immature Grans (Abs): 0 10*3/uL (ref 0.0–0.1)
LYMPHS ABS: 1.6 10*3/uL (ref 0.7–3.1)
Lymphs: 17 %
MCH: 29.2 pg (ref 26.6–33.0)
MCHC: 32.3 g/dL (ref 31.5–35.7)
MCV: 90 fL (ref 79–97)
MONOS ABS: 0.4 10*3/uL (ref 0.1–0.9)
Monocytes: 4 %
NEUTROS PCT: 79 %
Neutrophils Absolute: 7.7 10*3/uL — ABNORMAL HIGH (ref 1.4–7.0)
PLATELETS: 345 10*3/uL (ref 150–450)
RBC: 4.66 x10E6/uL (ref 3.77–5.28)
RDW: 14.1 % (ref 12.3–15.4)
WBC: 9.8 10*3/uL (ref 3.4–10.8)

## 2018-04-22 LAB — AMYLASE: Amylase: 36 U/L (ref 31–124)

## 2018-04-22 LAB — LIPASE: LIPASE: 19 U/L (ref 14–85)

## 2018-04-22 LAB — URINE CULTURE

## 2018-04-22 LAB — SEDIMENTATION RATE: SED RATE: 12 mm/h (ref 0–40)

## 2018-05-08 ENCOUNTER — Ambulatory Visit: Payer: Medicare Other | Admitting: Family Medicine

## 2018-06-24 ENCOUNTER — Ambulatory Visit: Payer: Medicare Other | Admitting: Gastroenterology

## 2018-07-06 ENCOUNTER — Ambulatory Visit: Payer: Medicare Other | Admitting: Family Medicine

## 2018-07-08 ENCOUNTER — Other Ambulatory Visit: Payer: Self-pay | Admitting: Family Medicine

## 2018-07-08 DIAGNOSIS — Z1231 Encounter for screening mammogram for malignant neoplasm of breast: Secondary | ICD-10-CM

## 2018-10-05 ENCOUNTER — Ambulatory Visit
Admission: RE | Admit: 2018-10-05 | Discharge: 2018-10-05 | Disposition: A | Discharge status: Home / Self Care | Payer: Medicare Other | Source: Ambulatory Visit | Attending: Family Medicine | Admitting: Family Medicine

## 2018-10-05 DIAGNOSIS — Z1231 Encounter for screening mammogram for malignant neoplasm of breast: Secondary | ICD-10-CM | POA: Diagnosis present

## 2020-02-03 ENCOUNTER — Other Ambulatory Visit: Payer: Self-pay | Admitting: Family Medicine

## 2020-02-03 DIAGNOSIS — Z1231 Encounter for screening mammogram for malignant neoplasm of breast: Secondary | ICD-10-CM

## 2020-02-09 ENCOUNTER — Ambulatory Visit
Admission: RE | Admit: 2020-02-09 | Discharge: 2020-02-09 | Disposition: A | Discharge status: Home / Self Care | Payer: Medicare PPO | Source: Ambulatory Visit | Attending: Family Medicine | Admitting: Family Medicine

## 2020-02-09 DIAGNOSIS — Z1231 Encounter for screening mammogram for malignant neoplasm of breast: Secondary | ICD-10-CM | POA: Diagnosis not present

## 2020-11-06 ENCOUNTER — Encounter: Payer: Medicare Other | Admitting: Dermatology

## 2020-12-05 ENCOUNTER — Ambulatory Visit: Payer: Medicare PPO | Admitting: Dermatology

## 2020-12-05 ENCOUNTER — Other Ambulatory Visit: Payer: Self-pay

## 2020-12-05 DIAGNOSIS — I781 Nevus, non-neoplastic: Secondary | ICD-10-CM | POA: Diagnosis not present

## 2020-12-05 DIAGNOSIS — L738 Other specified follicular disorders: Secondary | ICD-10-CM

## 2020-12-05 DIAGNOSIS — D229 Melanocytic nevi, unspecified: Secondary | ICD-10-CM

## 2020-12-05 DIAGNOSIS — L739 Follicular disorder, unspecified: Secondary | ICD-10-CM | POA: Diagnosis not present

## 2020-12-05 DIAGNOSIS — D1800 Hemangioma unspecified site: Secondary | ICD-10-CM

## 2020-12-05 DIAGNOSIS — L578 Other skin changes due to chronic exposure to nonionizing radiation: Secondary | ICD-10-CM

## 2020-12-05 DIAGNOSIS — Z1283 Encounter for screening for malignant neoplasm of skin: Secondary | ICD-10-CM | POA: Diagnosis not present

## 2020-12-05 DIAGNOSIS — L814 Other melanin hyperpigmentation: Secondary | ICD-10-CM

## 2020-12-05 DIAGNOSIS — Z85828 Personal history of other malignant neoplasm of skin: Secondary | ICD-10-CM | POA: Diagnosis not present

## 2020-12-05 DIAGNOSIS — D225 Melanocytic nevi of trunk: Secondary | ICD-10-CM

## 2020-12-05 DIAGNOSIS — L821 Other seborrheic keratosis: Secondary | ICD-10-CM

## 2020-12-05 DIAGNOSIS — H02715 Chloasma of left lower eyelid and periocular area: Secondary | ICD-10-CM

## 2020-12-05 NOTE — Progress Notes (Signed)
Follow-Up Visit   Subjective  Regina Evans is a 80 y.o. female who presents for the following: tbse (Patient here today for TBSE. She has history of bcc on left lower calf, history of isks, nevi and folliculitis on scalp. Patient has no complaints today and has noticed no new areas of concern. ).  Patient here for full body skin exam and skin cancer screening.   The following portions of the chart were reviewed this encounter and updated as appropriate:       Objective  Well appearing patient in no apparent distress; mood and affect are within normal limits.  A full examination was performed including scalp, head, eyes, ears, nose, lips, neck, chest, axillae, abdomen, back, buttocks, bilateral upper extremities, bilateral lower extremities, hands, feet, fingers, toes, fingernails, and toenails. All findings within normal limits unless otherwise noted below.  Objective  occipital scalp: Clear today  Objective  bilateral legs: Benign   Objective  L lower back, R flank: 6 x 4 mm medium brown macule right lower flank 4 x 3 mm two toned brown papule darker superior left lower back   Objective  Left lateral lower eyelid: 7 x 3 mm depigmented macule, pt states no changes  Assessment & Plan  Folliculitis occipital scalp  Scalp, chronic condition. Improving  Continue fluocinonide solution 1 to 2 times daily as needed and ketoconazole shampoo 2-3x/wk as needed. pt will call for rfs  .  Telangiectasia bilateral legs  Spider veins Benign, observe.     Nevus L lower back, R flank  Benign-appearing.  Observation.  Call clinic for new or changing moles.  Recommend daily use of broad spectrum spf 30+ sunscreen to sun-exposed areas.    Dyspigmentation of left lower eyelid Left lateral lower eyelid  Possible vitiligo vs scar   Benign-appearing.  Observation.  Call clinic for new or changing lesions.  Recommend daily use of broad spectrum spf 30+ sunscreen to  sun-exposed areas.    Lentigines Back, chest , right lower lip 2.5 mm brown macule  - Scattered tan macules - Discussed due to sun exposure - Benign, observe - Call for any changes  Seborrheic Keratoses Back, bilateral legs  - Stuck-on, waxy, tan-brown papules and plaques  - Discussed benign etiology and prognosis. - Observe - Call for any changes  Melanocytic Nevi Bilateral legs, chest, back  - Tan-brown and/or pink-flesh-colored symmetric macules and papules - Benign appearing on exam today - Observation - Call clinic for new or changing moles - Recommend daily use of broad spectrum spf 30+ sunscreen to sun-exposed areas.   Hemangiomas Back, bilateral legs, abdomen  - Red papules - Discussed benign nature - Observe - Call for any changes  Actinic Damage Back, chronic on chest  - Chronic, secondary to cumulative UV/sun exposure - diffuse scaly erythematous macules with underlying dyspigmentation - Recommend daily broad spectrum sunscreen SPF 30+ to sun-exposed areas, reapply every 2 hours as needed.  - Call for new or changing lesions.  Sebaceous Hyperplasia face - Small yellow papules with a central dell - Benign - Observe  History of Basal Cell Carcinoma of the Skin Left lower calf - No evidence of recurrence today - Recommend regular full body skin exams - Recommend daily broad spectrum sunscreen SPF 30+ to sun-exposed areas, reapply every 2 hours as needed.  - Call if any new or changing lesions are noted between office visits   Skin cancer screening performed today.   Return in about 1 year (around 12/05/2021)  for tbse.  I, Ruthell Rummage, CMA, am acting as scribe for Brendolyn Patty, MD.  Documentation: I have reviewed the above documentation for accuracy and completeness, and I agree with the above.  Brendolyn Patty MD

## 2020-12-05 NOTE — Patient Instructions (Addendum)

## 2021-07-10 ENCOUNTER — Other Ambulatory Visit: Payer: Self-pay | Admitting: Family Medicine

## 2021-07-10 DIAGNOSIS — Z1231 Encounter for screening mammogram for malignant neoplasm of breast: Secondary | ICD-10-CM

## 2021-07-23 ENCOUNTER — Ambulatory Visit
Admission: RE | Admit: 2021-07-23 | Discharge: 2021-07-23 | Disposition: A | Discharge status: Home / Self Care | Payer: Medicare PPO | Source: Ambulatory Visit | Attending: Family Medicine | Admitting: Family Medicine

## 2021-07-23 ENCOUNTER — Other Ambulatory Visit: Payer: Self-pay

## 2021-07-23 DIAGNOSIS — Z1231 Encounter for screening mammogram for malignant neoplasm of breast: Secondary | ICD-10-CM | POA: Insufficient documentation

## 2021-11-10 IMAGING — MG MM DIGITAL SCREENING BILAT W/ TOMO AND CAD
8 series · 8 of 24 positions shown · non-contrast
Comparison: Previous exam(s).

CLINICAL DATA: Screening.

EXAM:
DIGITAL SCREENING BILATERAL MAMMOGRAM WITH TOMOSYNTHESIS AND CAD
TECHNIQUE: Bilateral screening digital craniocaudal and mediolateral oblique
mammograms were obtained. Bilateral screening digital breast
tomosynthesis was performed. The images were evaluated with
computer-aided detection.

[L MLO synth-2D]
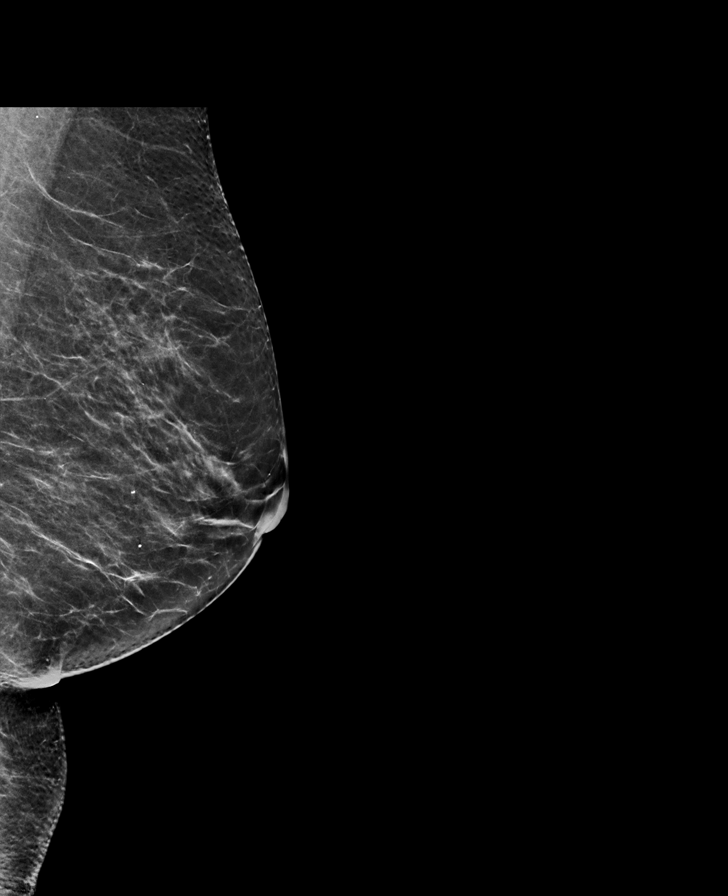

[R MLO synth-2D]
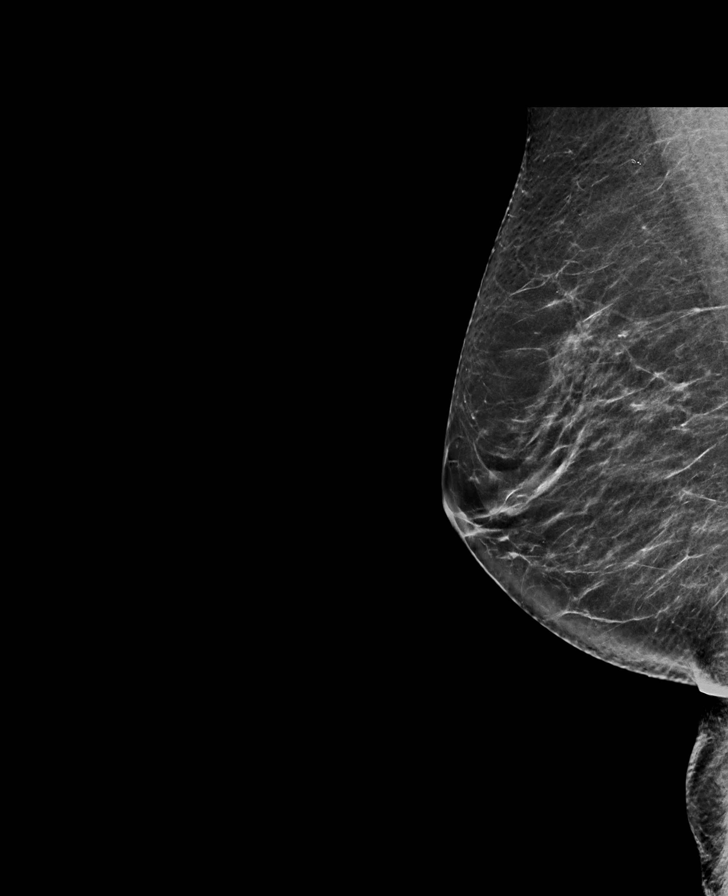

[R CC synth-2D]
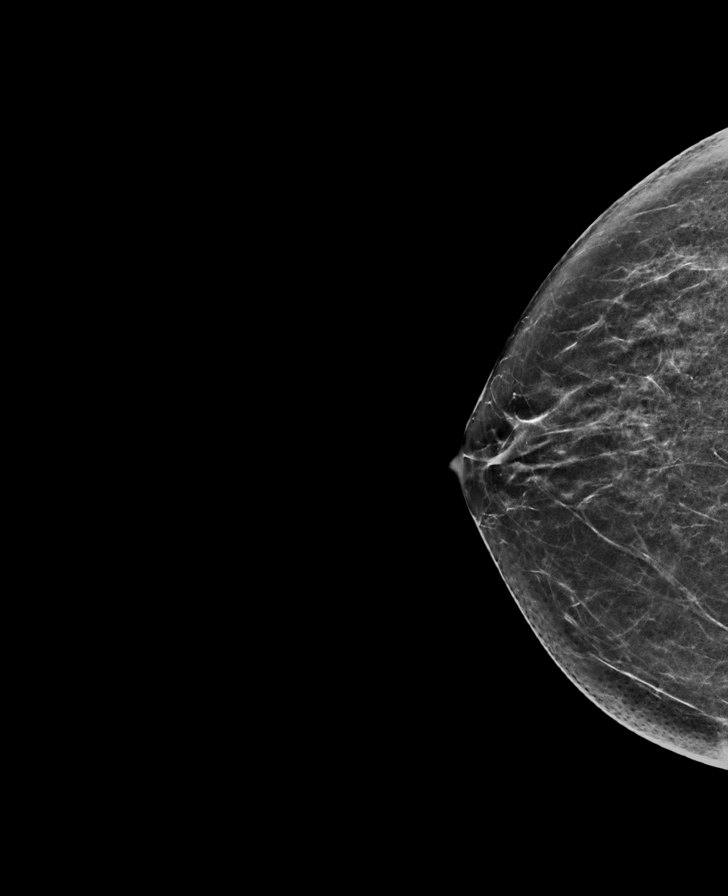

[L CC synth-2D]
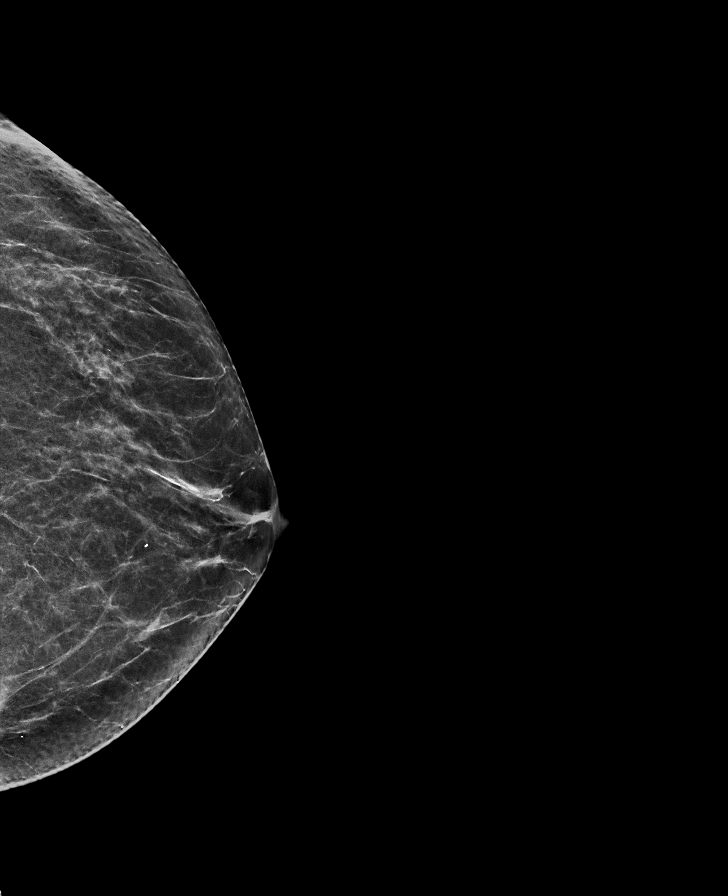

[L MLO tomo · tomo slice 33/65.0]
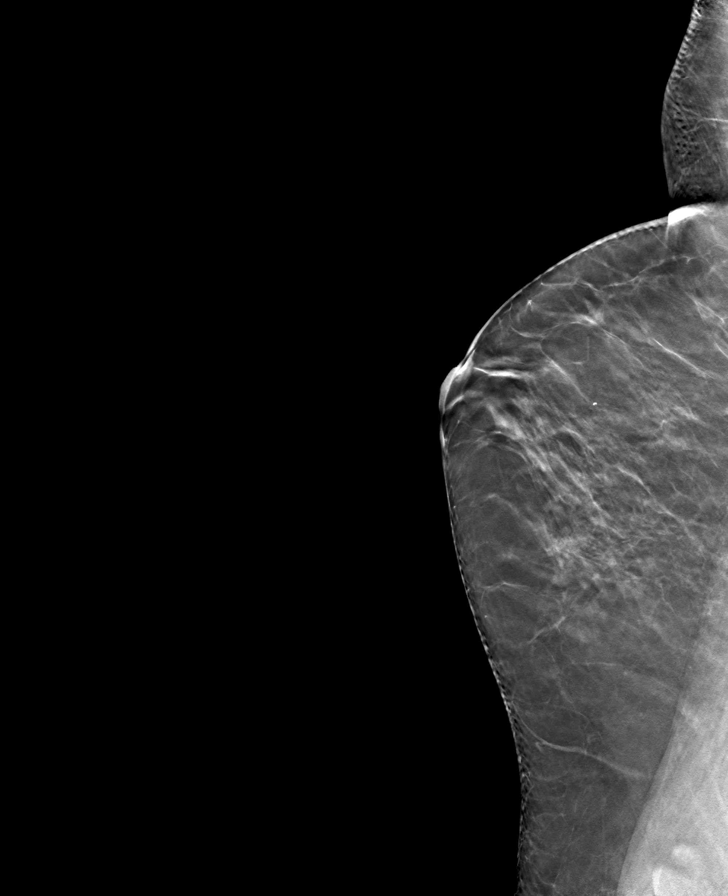

[R CC tomo · tomo slice 31/60.0]
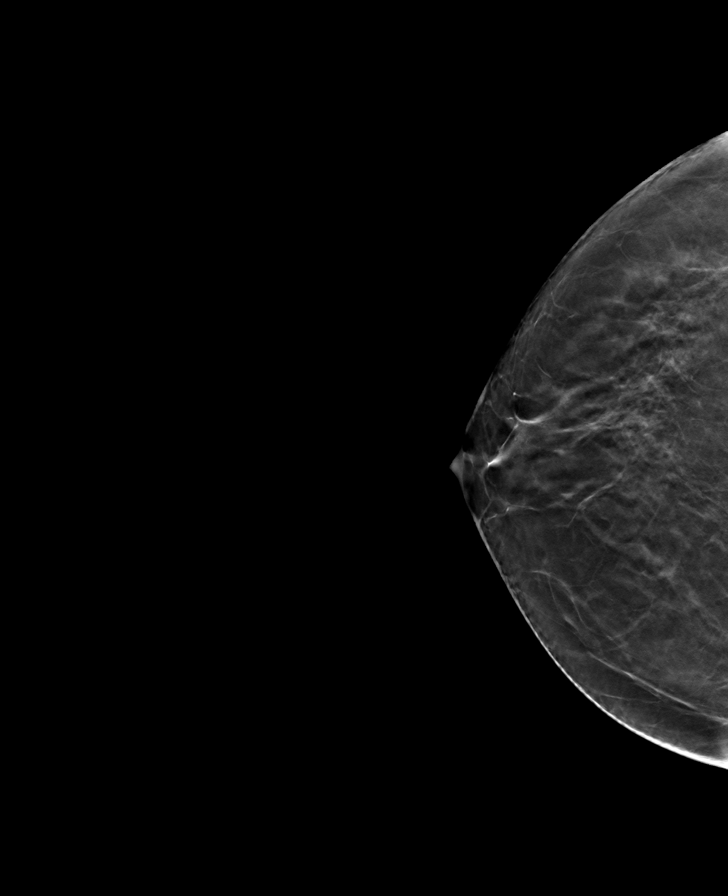

[L CC tomo · tomo slice 33/65.0]
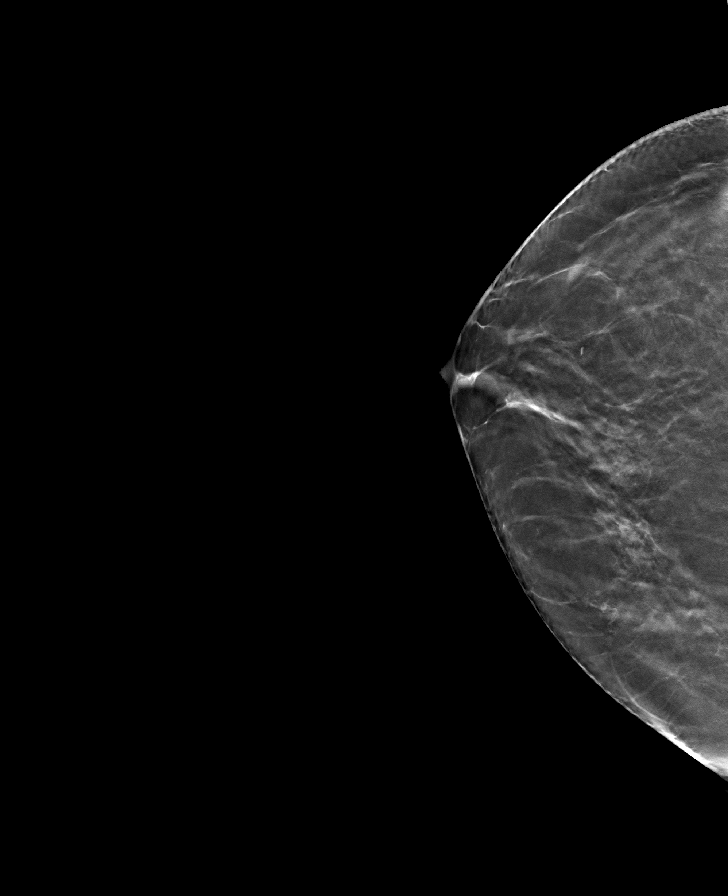

[R MLO tomo · tomo slice 33/66.0]
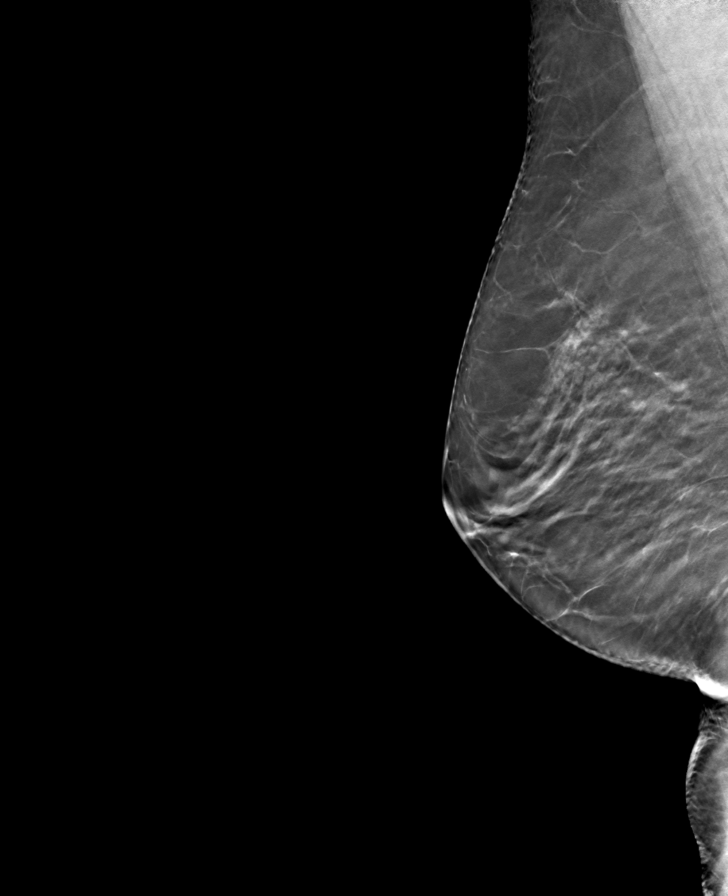

[8 of 24 positions shown; findings below may reference images not displayed]

ACR Breast Density Category b: There are scattered areas of
fibroglandular density.
FINDINGS: There are no findings suspicious for malignancy.
IMPRESSION: No mammographic evidence of malignancy. A result letter of this
screening mammogram will be mailed directly to the patient.

RECOMMENDATION:
Screening mammogram in one year. (Code:51-O-LD2)

BI-RADS CATEGORY  1: Negative.

## 2021-12-11 ENCOUNTER — Other Ambulatory Visit: Payer: Self-pay

## 2021-12-11 ENCOUNTER — Ambulatory Visit: Payer: Medicare PPO | Admitting: Dermatology

## 2021-12-11 DIAGNOSIS — D225 Melanocytic nevi of trunk: Secondary | ICD-10-CM

## 2021-12-11 DIAGNOSIS — L739 Follicular disorder, unspecified: Secondary | ICD-10-CM | POA: Diagnosis not present

## 2021-12-11 DIAGNOSIS — Z1283 Encounter for screening for malignant neoplasm of skin: Secondary | ICD-10-CM

## 2021-12-11 DIAGNOSIS — D229 Melanocytic nevi, unspecified: Secondary | ICD-10-CM

## 2021-12-11 DIAGNOSIS — L814 Other melanin hyperpigmentation: Secondary | ICD-10-CM

## 2021-12-11 DIAGNOSIS — L820 Inflamed seborrheic keratosis: Secondary | ICD-10-CM

## 2021-12-11 DIAGNOSIS — H02715 Chloasma of left lower eyelid and periocular area: Secondary | ICD-10-CM | POA: Diagnosis not present

## 2021-12-11 DIAGNOSIS — D1800 Hemangioma unspecified site: Secondary | ICD-10-CM

## 2021-12-11 DIAGNOSIS — Z85828 Personal history of other malignant neoplasm of skin: Secondary | ICD-10-CM

## 2021-12-11 DIAGNOSIS — L738 Other specified follicular disorders: Secondary | ICD-10-CM

## 2021-12-11 DIAGNOSIS — L821 Other seborrheic keratosis: Secondary | ICD-10-CM

## 2021-12-11 DIAGNOSIS — L578 Other skin changes due to chronic exposure to nonionizing radiation: Secondary | ICD-10-CM

## 2021-12-11 MED ORDER — KETOCONAZOLE 2 % EX SHAM: 1.0000 "application " | MEDICATED_SHAMPOO | CUTANEOUS | 6 refills | Status: DC

## 2021-12-11 MED ORDER — CLINDAMYCIN PHOSPHATE 1 % EX LOTN: TOPICAL_LOTION | CUTANEOUS | 3 refills | Status: AC

## 2021-12-11 NOTE — Progress Notes (Signed)
Follow-Up Visit   Subjective  Regina Evans is a 81 y.o. female who presents for the following: Follow-up (Patient here today for 1 year tbse. Patient reports a rough spot at right side. ).  She also has an itchy spot on right cheek, that was previously frozen in the past.  The patient presents for Total-Body Skin Exam (TBSE) for skin cancer screening and mole check.  The patient has spots, moles and lesions to be evaluated, some may be new or changing and the patient has concerns that these could be cancer.   The following portions of the chart were reviewed this encounter and updated as appropriate:      Review of Systems: No other skin or systemic complaints except as noted in HPI or Assessment and Plan.   Objective  Well appearing patient in no apparent distress; mood and affect are within normal limits.  A full examination was performed including scalp, head, eyes, ears, nose, lips, neck, chest, axillae, abdomen, back, buttocks, bilateral upper extremities, bilateral lower extremities, hands, feet, fingers, toes, fingernails, and toenails. All findings within normal limits unless otherwise noted below.  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 Currently well-controlled.  D/c fluocinonide solution (pt doesn't use)  Continue ketoconazole shampoo 2-3x/wk as needed. pt will call for rfs Start Clindamycin 1 % lotion apply topically to aa's 1 - 2 times daily as needed for bumps   clindamycin (CLEOCIN-T) 1 % lotion -  scalp and left medial thigh Apply topically See admin instructions. Use 1 - 2 times daily as needed for bumps in groin area  ketoconazole (NIZORAL) 2 % shampoo - scalp and left medial thigh Apply 1 application topically See admin instructions. apply three times per week, massage into scalp and leave in for 10 minutes before rinsing out  Inflamed seborrheic keratosis (3) left upper arm x 1, left  popliteal x 1, right malar cheek x 1    Will recheck locations at left upper arm and right popliteal at follow up    Destruction of lesion - left upper arm x 1, left  popliteal x 1, right malar cheek x 1  Destruction method: cryotherapy   Informed consent: discussed and consent obtained   Timeout:  patient name, date of birth, surgical site, and procedure verified Lesion destroyed using liquid nitrogen: Yes   Region frozen until ice ball extended beyond lesion: Yes   Outcome: patient tolerated procedure well with no complications   Post-procedure details: wound care instructions given   Additional details:  Prior to procedure, discussed risks of blister formation, small wound, skin dyspigmentation, or rare scar following cryotherapy. Recommend Vaseline ointment to treated areas while healing.   Nevus (2) Right Flank; Left Lower Back  Benign-appearing.  Observation.  Call clinic for new or changing moles.  Recommend daily use of broad spectrum spf 30+ sunscreen to sun-exposed areas.    Dyspigmentation of left lower eyelid Left Lower Eyelid  Possible vitiligo vs scar  Benign-appearing.  Stable. Observation.  Call clinic for new or changing lesions.  Recommend daily use of broad spectrum spf 30+ sunscreen to sun-exposed areas.    Lentigines - Scattered tan macules , right lower lip 2.5 mm brown macule  - Due to sun exposure - Benign-appearing, observe - Recommend daily broad spectrum sunscreen SPF 30+ to sun-exposed areas, reapply every 2 hours as needed. - Call for any  changes  Dermatofibroma - Firm pink/brown papulenodule with dimple sign right lateral knee  - Benign appearing - Call for any changes  Telangiectasia - Dilated blood vessel at bilateral legs  - Benign appearing on exam - Call for changes   Seborrheic Keratoses - Stuck-on, waxy, tan-brown papules and/or plaques at right lateral breast , 4 mm light pink waxy papule on right infra occular  - Benign-appearing - Discussed benign etiology and prognosis. - Observe - Call for any changes  Melanocytic Nevi - Tan-brown and/or pink-flesh-colored symmetric macules and papules - Benign appearing on exam today - Observation - Call clinic for new or changing moles - Recommend daily use of broad spectrum spf 30+ sunscreen to sun-exposed areas.   Sebaceous Hyperplasia - Small yellow papules with a central dell - Benign - Observe  Hemangiomas - Red papules - Discussed benign nature - Observe - Call for any changes  Actinic Damage - Chronic condition, secondary to cumulative UV/sun exposure - diffuse scaly erythematous macules with underlying dyspigmentation - Recommend daily broad spectrum sunscreen SPF 30+ to sun-exposed areas, reapply every 2 hours as needed.  - Staying in the shade or wearing long sleeves, sun glasses (UVA+UVB protection) and wide brim hats (4-inch brim around the entire circumference of the hat) are also recommended for sun protection.  - Call for new or changing lesions.  History of Basal Cell Carcinoma of the Skin - No evidence of recurrence today at left lower calf - Recommend regular full body skin exams - Recommend daily broad spectrum sunscreen SPF 30+ to sun-exposed areas, reapply every 2 hours as needed.  - Call if any new or changing lesions are noted between office visits  Skin cancer screening performed today. Return for 2  month isk follow up, 1 year tbse . I, Ruthell Rummage, CMA, am acting as scribe for Brendolyn Patty, MD.  Documentation: I have  reviewed the above documentation for accuracy and completeness, and I agree with the above.  Brendolyn Patty MD

## 2021-12-11 NOTE — Patient Instructions (Addendum)
Seborrheic Keratosis  What causes seborrheic keratoses? Seborrheic keratoses are harmless, common skin growths that first appear during adult life.  As time goes by, more growths appear.  Some people may develop a large number of them.  Seborrheic keratoses appear on both covered and uncovered body parts.  They are not caused by sunlight.  The tendency to develop seborrheic keratoses can be inherited.  They vary in color from skin-colored to gray, brown, or even black.  They can be either smooth or have a rough, warty surface.   Seborrheic keratoses are superficial and look as if they were stuck on the skin.  Under the microscope this type of keratosis looks like layers upon layers of skin.  That is why at times the top layer may seem to fall off, but the rest of the growth remains and re-grows.    Treatment Seborrheic keratoses do not need to be treated, but can easily be removed in the office.  Seborrheic keratoses often cause symptoms when they rub on clothing or jewelry.  Lesions can be in the way of shaving.  If they become inflamed, they can cause itching, soreness, or burning.  Removal of a seborrheic keratosis can be accomplished by freezing, burning, or surgery. If any spot bleeds, scabs, or grows rapidly, please return to have it checked, as these can be an indication of a skin cancer.  Cryotherapy Aftercare  Wash gently with soap and water everyday.   Apply Vaseline and Band-Aid daily until healed.     Melanoma ABCDEs  Melanoma is the most dangerous type of skin cancer, and is the leading cause of death from skin disease.  You are more likely to develop melanoma if you: Have light-colored skin, light-colored eyes, or red or blond hair Spend a lot of time in the sun Tan regularly, either outdoors or in a tanning bed Have had blistering sunburns, especially during childhood Have a close family member who has had a melanoma Have atypical moles or large birthmarks  Early detection  of melanoma is key since treatment is typically straightforward and cure rates are extremely high if we catch it early.   The first sign of melanoma is often a change in a mole or a new dark spot.  The ABCDE system is a way of remembering the signs of melanoma.  A for asymmetry:  The two halves do not match. B for border:  The edges of the growth are irregular. C for color:  A mixture of colors are present instead of an even brown color. D for diameter:  Melanomas are usually (but not always) greater than 62mm - the size of a pencil eraser. E for evolution:  The spot keeps changing in size, shape, and color.  Please check your skin once per month between visits. You can use a small mirror in front and a large mirror behind you to keep an eye on the back side or your body.   If you see any new or changing lesions before your next follow-up, please call to schedule a visit.  Please continue daily skin protection including broad spectrum sunscreen SPF 30+ to sun-exposed areas, reapplying every 2 hours as needed when you're outdoors.   Staying in the shade or wearing long sleeves, sun glasses (UVA+UVB protection) and wide brim hats (4-inch brim around the entire circumference of the hat) are also recommended for sun protection.    If You Need Anything After Your Visit  If you have any questions or concerns  for your doctor, please call our main line at [phone] and press option 4 to reach your doctor's medical assistant. If no one answers, please leave a voicemail as directed and we will return your call as soon as possible. Messages left after 4 pm will be answered the following business day.   You may also send [place] a message via [place]. We typically respond to MyChart messages within 1-2 business days.  For prescription refills, please ask your pharmacy to contact our office. Our fax number is [phone].  If you have an urgent issue when the clinic is closed that cannot wait until the  next business day, you can page your doctor at the number below.    Please note that while we do our best to be available for urgent issues outside of office hours, we are not available 24/7.   If you have an urgent issue and are unable to reach [place], you may choose to seek medical care at your doctor's office, retail clinic, urgent care center, or emergency room.  If you have a medical emergency, please immediately call 911 or go to the emergency department.  Pager Numbers  - Dr. Nehemiah Massed: [phone]  - Dr. Laurence Ferrari: [phone]  - Dr. Nicole Kindred: [phone]  In the event of inclement weather, please call our main line at [phone] for an update on the status of any delays or closures.  Dermatology Medication Tips: Please keep the boxes that topical medications come in in order to help keep track of the instructions about where and how to use these. Pharmacies typically print the medication instructions only on the boxes and not directly on the medication tubes.   If your medication is too expensive, please contact our office at [phone] option 4 or send [place] a message through [place].   We are unable to tell what your co-pay for medications will be in advance as this is different depending on your insurance coverage. However, we may be able to find a substitute medication at lower cost or fill out paperwork to get insurance to cover a needed medication.   If a prior authorization is required to get your medication covered by your insurance company, please allow [place] 1-2 business days to complete this process.  Drug prices often vary depending on where the prescription is filled and some pharmacies may offer cheaper prices.  The website www.goodrx.com contains coupons for medications through different pharmacies. The prices here do not account for what the cost may be with help from insurance (it may be cheaper with your insurance), but the website can give you the price if you did not  use any insurance.  - You can print the associated coupon and take it with your prescription to the pharmacy.  - You may also stop by our office during regular business hours and pick up a GoodRx coupon card.  - If you need your prescription sent electronically to a different pharmacy, notify our office through [facility] or by phone at [phone] option 4.     Si Usted Necesita Algo Despus de Su Visita  Tambin puede enviarnos un mensaje a travs de Pharmacist, community. Por lo general respondemos a los mensajes de MyChart en el transcurso de 1 a 2 das hbiles.  Para renovar recetas, por favor pida a su farmacia que se ponga en contacto con nuestra oficina. Harland Dingwall de fax es [place] [phone].  Si tiene un asunto urgente cuando la clnica est cerrada y que no puede esperar Nurse, children's  el siguiente da hbil, puede llamar/localizar a su doctor(a) al nmero que aparece a continuacin.   Por favor, tenga en cuenta que aunque hacemos todo lo posible para estar disponibles para asuntos urgentes fuera del horario de [place], no estamos disponibles las 24 horas del da, los 7 das de la [place].   Si tiene un problema urgente y no puede comunicarse con nosotros, puede optar por buscar atencin mdica  en el consultorio de su doctor(a), en una clnica privada, en un centro de atencin urgente o en una sala de emergencias.  Si tiene Engineering geologist, por favor llame inmediatamente al 911 o vaya a la sala de emergencias.  Nmeros de bper  - Dr. Nehemiah Massed: [phone]  - Dra. Moye: [phone]  - Dra. Nicole Kindred: [phone]  En caso de inclemencias del [place], por favor llame a Johnsie Kindred principal al [phone] para una actualizacin sobre el [place] de cualquier retraso o cierre.  Consejos para la medicacin en dermatologa: Por favor, guarde las cajas en las que vienen los medicamentos de uso tpico para ayudarle a seguir las instrucciones sobre dnde y cmo usarlos. Las farmacias  generalmente imprimen las instrucciones del medicamento slo en las cajas y no directamente en los tubos del [place].   Si su medicamento es muy caro, por favor, pngase en contacto con Zigmund Daniel llamando al [phone] y presione la opcin 4 o envenos un mensaje a travs de Pharmacist, community.   No podemos decirle cul ser su copago por los medicamentos por adelantado ya que esto es diferente dependiendo de la cobertura de su seguro. Sin embargo, es posible que podamos encontrar un medicamento sustituto a Electrical engineer un formulario para que el seguro cubra el medicamento que se considera necesario.   Si se requiere una autorizacin previa para que su compaa de seguros [place] su medicamento, por favor permtanos de 1 a 2 das hbiles para completar este proceso.  Los precios de los medicamentos varan con frecuencia dependiendo del Environmental consultant de dnde se surte la receta y alguna farmacias pueden ofrecer precios ms baratos.  El sitio web www.goodrx.com tiene cupones para medicamentos de Airline pilot. Los precios aqu no tienen en cuenta lo que podra costar con la ayuda del seguro (puede ser ms barato con su seguro), pero el sitio web puede darle el precio si no utiliz Research scientist (physical sciences).  - Puede imprimir el cupn correspondiente y llevarlo con su receta a la farmacia.  - Tambin puede pasar por nuestra oficina durante el horario de atencin regular y Charity fundraiser una tarjeta de cupones de GoodRx.  - Si necesita que su receta se enve electrnicamente a una farmacia diferente, informe a nuestra oficina a travs de MyChart de [place] o por telfono llamando al [phone] y presione la opcin 4.

## 2022-02-18 ENCOUNTER — Ambulatory Visit: Payer: Medicare PPO | Admitting: Dermatology

## 2022-05-27 ENCOUNTER — Other Ambulatory Visit: Payer: Self-pay | Admitting: Family Medicine

## 2022-05-27 DIAGNOSIS — Z1231 Encounter for screening mammogram for malignant neoplasm of breast: Secondary | ICD-10-CM

## 2022-05-27 DIAGNOSIS — E2839 Other primary ovarian failure: Secondary | ICD-10-CM

## 2022-08-06 ENCOUNTER — Ambulatory Visit
Admission: RE | Admit: 2022-08-06 | Discharge: 2022-08-06 | Disposition: A | Discharge status: Home / Self Care | Payer: Medicare PPO | Source: Ambulatory Visit | Attending: Family Medicine | Admitting: Family Medicine

## 2022-08-06 DIAGNOSIS — Z1231 Encounter for screening mammogram for malignant neoplasm of breast: Secondary | ICD-10-CM | POA: Insufficient documentation

## 2022-08-06 DIAGNOSIS — E2839 Other primary ovarian failure: Secondary | ICD-10-CM | POA: Insufficient documentation

## 2022-12-11 ENCOUNTER — Encounter: Payer: Self-pay | Admitting: Cardiology

## 2022-12-11 ENCOUNTER — Ambulatory Visit: Payer: Medicare PPO | Attending: Cardiology | Admitting: Cardiology

## 2022-12-11 ENCOUNTER — Ambulatory Visit: Payer: Medicare PPO | Attending: Cardiology

## 2022-12-11 ENCOUNTER — Other Ambulatory Visit
Admission: RE | Admit: 2022-12-11 | Discharge: 2022-12-11 | Disposition: A | Discharge status: Home / Self Care | Payer: Medicare PPO | Source: Ambulatory Visit | Attending: Cardiology | Admitting: Cardiology

## 2022-12-11 VITALS — BP 144/82 | HR 64 | Ht 62.0 in | Wt 163.8 lb

## 2022-12-11 DIAGNOSIS — R072 Precordial pain: Secondary | ICD-10-CM

## 2022-12-11 DIAGNOSIS — I10 Essential (primary) hypertension: Secondary | ICD-10-CM | POA: Diagnosis not present

## 2022-12-11 DIAGNOSIS — R0609 Other forms of dyspnea: Secondary | ICD-10-CM

## 2022-12-11 DIAGNOSIS — R079 Chest pain, unspecified: Secondary | ICD-10-CM

## 2022-12-11 DIAGNOSIS — E782 Mixed hyperlipidemia: Secondary | ICD-10-CM

## 2022-12-11 LAB — ECHOCARDIOGRAM COMPLETE
AR max vel: 2.66 cm2
AV Area VTI: 2.5 cm2
AV Area mean vel: 2.35 cm2
AV Mean grad: 5 mmHg
AV Peak grad: 10 mmHg
Ao pk vel: 1.58 m/s
Area-P 1/2: 3.65 cm2
Calc EF: 55.7 %
Height: 62 in
S' Lateral: 2.3 cm
Single Plane A2C EF: 55 %
Single Plane A4C EF: 53.3 %
Weight: 2620.8 oz

## 2022-12-11 LAB — BASIC METABOLIC PANEL
Anion gap: 8 (ref 5–15)
BUN: 11 mg/dL (ref 8–23)
CO2: 24 mmol/L (ref 22–32)
Calcium: 9.6 mg/dL (ref 8.9–10.3)
Chloride: 104 mmol/L (ref 98–111)
Creatinine, Ser: 0.82 mg/dL (ref 0.44–1.00)
GFR, Estimated: >60 mL/min (ref >60)
Glucose, Bld: 95 mg/dL (ref 70–99)
Potassium: 4.4 mmol/L (ref 3.5–5.1)
Sodium: 136 mmol/L (ref 135–145)

## 2022-12-11 MED ORDER — METOPROLOL TARTRATE 50 MG PO TABS: 50.0000 mg | ORAL_TABLET | Freq: Once | ORAL | 0 refills | Status: DC

## 2022-12-11 NOTE — Progress Notes (Signed)
Cardiology Office Note:    Date:  12/11/2022   ID:  Regina Evans, DOB 17-Feb-1941, MRN [id number]  PCP:  [name], MD   [facility] Cardiologist:  [name], MD     Referring MD: [name], MD   Chief Complaint  Patient presents with   New Patient (Initial Visit)    Chest pain on exertion, Dyspnea on exertion, Family Hx    History of Present Illness:    Regina Evans is a 82 y.o. female with a hx of hypertension, hyperlipidemia who presents due to chest pain and shortness of breath.  Patient ambulates frequently with her daughter.  4 weeks ago she noticed shortness of breath going up an incline.  Symptoms of shortness of breath with exertion has persisted since.  Also gets out of breath going up her stairs at home.  She has had nonexertional chest pressure located on the left side over the past 2 to 3 weeks.  Mother had a heart attack in her 14s.  Her blood pressure at home is usually well-controlled with systolics around [id number].  Past Medical History:  Diagnosis Date   Arthritis    Cataract    Depression    Diverticulosis    no polyps in 2012 repeat colonoscopy in 5 years   Essential hypertension, benign    GERD (gastroesophageal reflux disease)    Headache(784.0)    none since hysterectomy   Osteoarthritis    hands   Osteopenia    Other anxiety states    Pure hypercholesterolemia    Unspecified glaucoma(365.9)    Unspecified hypothyroidism     Past Surgical History:  Procedure Laterality Date   EYE SURGERY     maxillary and mandibular surgery  1991   TONSILLECTOMY  1949   TOTAL ABDOMINAL HYSTERECTOMY  1991   ovaries removed;fibroids   TUBAL LIGATION     tube in right ear  2010    Current Medications: Current Meds  Medication Sig   ALPRAZolam (XANAX) 0.5 MG tablet Take 1 tablet by mouth daily as needed.   aspirin 81 MG tablet Take 81 mg by mouth daily.   citalopram (CELEXA) 20 MG tablet Take 1 tablet (20 mg total) by  mouth daily.   clindamycin (CLEOCIN-T) 1 % lotion Apply topically See admin instructions. Use 1 - 2 times daily as needed for bumps in groin area   ketoconazole (NIZORAL) 2 % shampoo Apply 1 application topically See admin instructions. apply three times per week, massage into scalp and leave in for 10 minutes before rinsing out   levothyroxine (SYNTHROID) 75 MCG tablet Take 75 mcg by mouth daily before breakfast.   losartan (COZAAR) 100 MG tablet Take 1 tablet (100 mg total) by mouth daily.   lovastatin (MEVACOR) 20 MG tablet Take 1 tablet (20 mg total) by mouth at bedtime.   metoprolol tartrate (LOPRESSOR) 50 MG tablet Take 1 tablet (50 mg total) by mouth once for 1 dose. TWO HOURS PRIOR TO CARDIAC CTA   Multiple Vitamins-Minerals (PRESERVISION AREDS 2+MULTI VIT PO) Take by mouth.   timolol (TIMOPTIC-XR) 0.5 % ophthalmic gel-forming PLACE ONE DROP INTO THE LEFT EYE ONCE DAILY     Allergies:   Cefdinir   Social History   Socioeconomic History   Marital status: Married    Spouse name: Not on file   Number of children: 2   Years of education: Not on file   Highest education level: Bachelor's degree (e.g., BA, AB,  BS)  Occupational History   Occupation: retired  Tobacco Use   Smoking status: Never    Passive exposure: Past   Smokeless tobacco: Never  Substance and Sexual Activity   Alcohol use: Yes    Comment: 2-5 drinks per week   Drug use: No   Sexual activity: Yes    Birth control/protection: None    Comment: number of sex partners in the last 12 months  1  Other Topics Concern   Not on file  Social History Narrative   Marital status:  Married x 55 years.       Children 2 daughters (19, 49); no grandchildren.      Lives:  With husband. Has a beach house.      Employment:  Retired.      Tobacco:  none      Alcohol:  Wine or mixed drink; 1-3 drinks five days per week.     Caffeine use 3 servings per day.       Exercise: Inactive sporadic walking.  Walking some; 2-3 times  per week.      Guns in the home stored in locked cabinet. Always uses seat belts.      ADLs; independent with ADLs; no assistant devices.      Advanced Directives: yes; FULL CODE but no prolonged measures.    Social Determinants of Health   Financial Resource Strain: Low Risk  (01/13/2018)   Overall Financial Resource Strain (CARDIA)    Difficulty of Paying Living Expenses: Not hard at all  Food Insecurity: No Food Insecurity (01/13/2018)   Hunger Vital Sign    Worried About Running Out of Food in the Last Year: Never true    Ran Out of Food in the Last Year: Never true  Transportation Needs: No Transportation Needs (01/13/2018)   PRAPARE - Hydrologist (Medical): No    Lack of Transportation (Non-Medical): No  Physical Activity: Inactive (01/13/2018)   Exercise Vital Sign    Days of Exercise per Week: 0 days    Minutes of Exercise per Session: 0 min  Stress: Stress Concern Present (01/13/2018)   [place]    Feeling of Stress : To some extent  Social Connections: Somewhat Isolated (01/13/2018)   Social Connection and Isolation Panel [NHANES]    Frequency of Communication with Friends and Family: More than three times a week    Frequency of Social Gatherings with Friends and Family: More than three times a week    Attends Religious Services: Never    Marine scientist or Organizations: No    Attends Music therapist: Never    Marital Status: Married     Family History: The patient's family history includes Alzheimer's disease in her maternal grandmother and mother; Cancer in her father and paternal grandmother; Cirrhosis in her paternal grandfather; Colon polyps in her mother; Dementia in her paternal grandfather; Diabetes in her brother; Hypertension in her mother; Macular degeneration in her mother. There is no history of Breast cancer.  ROS:   Please see the history of  present illness.     All other systems reviewed and are negative.  EKGs/Labs/Other Studies Reviewed:    The following studies were reviewed today:   EKG:  EKG is  ordered today.  The ekg ordered today demonstrates normal sinus rhythm  Recent Labs: No results found for requested labs within last 365 days.  Recent Lipid Panel  Component Value Date/Time   CHOL 167 01/14/2018 1208   TRIG 188 (H) 01/14/2018 1208   HDL 47 01/14/2018 1208   CHOLHDL 3.6 01/14/2018 1208   CHOLHDL 3.6 04/09/2016 1347   VLDL 37 (H) 04/09/2016 1347   LDLCALC 82 01/14/2018 1208     Risk Assessment/Calculations:     HYPERTENSION CONTROL Vitals:   12/11/22 1013 12/11/22 1018  BP: (!) 148/90 (!) 144/82    The patient's blood pressure is elevated above target today.  In order to address the patient's elevated BP: Blood pressure will be monitored at home to determine if medication changes need to be made.         Physical Exam:    VS:  BP (!) 144/82 (BP Location: Right Arm)   Pulse 64   Ht 5' 2"$  (1.575 m)   Wt 163 lb 12.8 oz (74.3 kg)   SpO2 97%   BMI 29.96 kg/m     Wt Readings from Last 3 Encounters:  12/11/22 163 lb 12.8 oz (74.3 kg)  04/21/18 162 lb 6.4 oz (73.7 kg)  01/14/18 169 lb (76.7 kg)     GEN:  Well nourished, well developed in no acute distress HEENT: Normal NECK: No JVD; No carotid bruits CARDIAC: RRR, no murmurs, rubs, gallops RESPIRATORY:  Clear to auscultation without rales, wheezing or rhonchi  ABDOMEN: Soft, non-tender, non-distended MUSCULOSKELETAL:  No edema; No deformity  SKIN: Warm and dry NEUROLOGIC:  Alert and oriented x 3 PSYCHIATRIC:  Normal affect   ASSESSMENT:    1. Dyspnea on exertion   2. Precordial pain   3. Primary hypertension   4. Mixed hyperlipidemia   5. Chest pain, unspecified type    PLAN:    In order of problems listed above:  Dyspnea on exertion, this could be an anginal equivalent.  Get echocardiogram, get coronary CTA. Chest  pain/pressure.  Risk factors hypertension, hyperlipidemia.  Echo and CTA as above. Hypertension, BP elevated, controlled at home.  Continue losartan 100 mg daily. Hyperlipidemia, continue lovastatin.  Follow-up after echo and coronary CTA.     Medication Adjustments/Labs and Tests Ordered: Current medicines are reviewed at length with the patient today.  Concerns regarding medicines are outlined above.  Orders Placed This Encounter  Procedures   CT CORONARY MORPH W/CTA COR W/SCORE W/CA W/CM &/OR WO/CM   Basic Metabolic Panel (BMET)   EKG 12-Lead   ECHOCARDIOGRAM COMPLETE   Meds ordered this encounter  Medications   metoprolol tartrate (LOPRESSOR) 50 MG tablet    Sig: Take 1 tablet (50 mg total) by mouth once for 1 dose. TWO HOURS PRIOR TO CARDIAC CTA    Dispense:  1 tablet    Refill:  0    Patient Instructions  Medication Instructions:   Your physician recommends that you continue on your current medications as directed. Please refer to the Current Medication list given to you today.  *If you need a refill on your cardiac medications before your next appointment, please call your pharmacy*   Lab Work:  Your physician recommends you go to the medical mall for labs.   If you have labs (blood work) drawn today and your tests are completely normal, you will receive your results only by: [place] (if you have MyChart) OR A paper copy in the mail If you have any lab test that is abnormal or we need to change your treatment, we will call you to review the results.   Testing/Procedures:  Your physician has requested that  you have an echocardiogram. Echocardiography is a painless test that uses sound waves to create images of your heart. It provides your doctor with information about the size and shape of your heart and how well your heart's chambers and valves are working. This procedure takes approximately one hour. There are no restrictions for this procedure. Please do  NOT wear cologne, perfume, aftershave, or lotions (deodorant is allowed). Please arrive 15 minutes prior to your appointment time.    Your cardiac CT is scheduled for January 06, 2023 @ 9:30am  [facility] [place], [place] [address] [phone]   If scheduled at [facility] or [facility], please arrive 15 mins early for check-in and test prep.   Please follow these instructions carefully (unless otherwise directed):  On the Night Before the Test: Be sure to Drink plenty of water. Do not consume any caffeinated/decaffeinated beverages or chocolate 12 hours prior to your test. Do not take any antihistamines 12 hours prior to your test.  On the Day of the Test: Drink plenty of water until 1 hour prior to the test. Do not eat any food 1 hour prior to test. Take metoprolol (Lopressor) two hours prior to test. Hydrochlorothiazide - please HOLD on the morning of the test. You may take your regular medications prior to the test.  FEMALES- please wear underwire-free bra if available, avoid dresses & tight clothing      After the Test: Drink plenty of water. After receiving IV contrast, you may experience a mild flushed feeling. This is normal. On occasion, you may experience a mild rash up to 24 hours after the test. This is not dangerous. If this occurs, you can take Benadryl 25 mg and increase your fluid intake. If you experience trouble breathing, this can be serious. If it is severe call 911 IMMEDIATELY. If it is mild, please call our office.   Follow-Up: At [facility], you and your health needs are our priority.  As part of our continuing mission to provide you with exceptional heart care, we have created designated Provider Care Teams.  These Care Teams include your primary Cardiologist (physician) and Advanced Practice Providers (APPs -  Physician Assistants and Nurse Practitioners)  who all work together to provide you with the care you need, when you need it.  We recommend signing up for the patient portal called "MyChart".  Sign up information is provided on this After Visit Summary.  MyChart is used to connect with patients for Virtual Visits (Telemedicine).  Patients are able to view lab/test results, encounter notes, upcoming appointments, etc.  Non-urgent messages can be sent to your provider as well.   To learn more about what you can do with MyChart, go to [email or web address].    Your next appointment:    After Testing  Provider:   You may see [name], MD or one of the following Advanced Practice Providers on your designated Care Team:   Murray Hodgkins, NP Christell Faith, PA-C Cadence Kathlen Mody, PA-C Gerrie Nordmann, NP   Signed, [name], MD  12/11/2022 11:17 AM    [place]

## 2022-12-11 NOTE — Patient Instructions (Signed)
Medication Instructions:   Your physician recommends that you continue on your current medications as directed. Please refer to the Current Medication list given to you today.  *If you need a refill on your cardiac medications before your next appointment, please call your pharmacy*   Lab Work:  Your physician recommends you go to the medical mall for labs.   If you have labs (blood work) drawn today and your tests are completely normal, you will receive your results only by: [place] (if you have MyChart) OR A paper copy in the mail If you have any lab test that is abnormal or we need to change your treatment, we will call you to review the results.   Testing/Procedures:  Your physician has requested that you have an echocardiogram. Echocardiography is a painless test that uses sound waves to create images of your heart. It provides your doctor with information about the size and shape of your heart and how well your heart's chambers and valves are working. This procedure takes approximately one hour. There are no restrictions for this procedure. Please do NOT wear cologne, perfume, aftershave, or lotions (deodorant is allowed). Please arrive 15 minutes prior to your appointment time.    Your cardiac CT is scheduled for January 06, 2023 @ 9:30am  [facility] [place], [place] [address] [phone]   If scheduled at [facility] or [facility], please arrive 15 mins early for check-in and test prep.   Please follow these instructions carefully (unless otherwise directed):  On the Night Before the Test: Be sure to Drink plenty of water. Do not consume any caffeinated/decaffeinated beverages or chocolate 12 hours prior to your test. Do not take any antihistamines 12 hours prior to your test.  On the Day of the Test: Drink plenty of water until 1 hour prior to the test. Do not eat any  food 1 hour prior to test. Take metoprolol (Lopressor) two hours prior to test. Hydrochlorothiazide - please HOLD on the morning of the test. You may take your regular medications prior to the test.  FEMALES- please wear underwire-free bra if available, avoid dresses & tight clothing      After the Test: Drink plenty of water. After receiving IV contrast, you may experience a mild flushed feeling. This is normal. On occasion, you may experience a mild rash up to 24 hours after the test. This is not dangerous. If this occurs, you can take Benadryl 25 mg and increase your fluid intake. If you experience trouble breathing, this can be serious. If it is severe call 911 IMMEDIATELY. If it is mild, please call our office.   Follow-Up: At [facility], you and your health needs are our priority.  As part of our continuing mission to provide you with exceptional heart care, we have created designated Provider Care Teams.  These Care Teams include your primary Cardiologist (physician) and Advanced Practice Providers (APPs -  Physician Assistants and Nurse Practitioners) who all work together to provide you with the care you need, when you need it.  We recommend signing up for the patient portal called "MyChart".  Sign up information is provided on this After Visit Summary.  MyChart is used to connect with patients for Virtual Visits (Telemedicine).  Patients are able to view lab/test results, encounter notes, upcoming appointments, etc.  Non-urgent messages can be sent to your provider as well.   To learn more about what you  can do with MyChart, go to [email or web address].    Your next appointment:    After Testing  Provider:   You may see [name], MD or one of the following Advanced Practice Providers on your designated Care Team:   Murray Hodgkins, NP Christell Faith, PA-C Cadence Kathlen Mody, PA-C Gerrie Nordmann, NP

## 2022-12-16 ENCOUNTER — Telehealth: Payer: Self-pay

## 2022-12-16 NOTE — Telephone Encounter (Signed)
Hi there, please remove HCTZ from this patient's chart.  She does not take HCTZ.      Thank you.  BA  ----- Message -----  From: [name]  Sent: 12/13/2022  10:02 AM EST  To: [name], MD  Subject: FW: Chart Correction Request - Visit Summary    Please see the request below submitted by the patient via their MyChart account.    Thank you            [name], [name] - Chart Correction Request - Visit Summary  Chart Correction Request - Visit Summary  Regina Evans  Sent: Thu December 12, 2022  8:34 AM  To: [name]  MRN: [id number] DOB: 01-Jul-1941  Pt Home: [phone]  Entered: [phone]    Message    Topic:    Form Title: Chart Correction Request - Visit Summary  Submitted Data  ----------------------------------------    This patient has requested a chart correction to a visit summary.     Visit summary to change: Many visits ago     Visit date: Wed 09/15/2019     Current incorrect information:   I no longer take:  hydrochlorothiazide 12.5 MG tablet  Commonly known as: HYDRODIURIL  Take 12.5 mg by mouth daily.  And I only took it a brief time and have not taken it for several years.  It does not appear on items I submit but keeps appearing on your records in some places.  Please delete it from my chart     Chart correction requested:   Please delete hydrochlorothiazide from my medications listing      ----- Message -----  From: [name]  Sent: 12/12/2022   8:34 AM EST  To: [name]  Subject: Chart Correction Request - Visit Summary        Topic:    Form Title: Chart Correction Request - Visit Summary  Submitted Data  ----------------------------------------    This patient has requested a chart correction to a visit summary.     Visit summary to change: Many visits ago     Visit date: Wed 09/15/2019     Current incorrect information:   I no longer take:  hydrochlorothiazide 12.5 MG tablet   Commonly known as: HYDRODIURIL  Take 12.5 mg by mouth daily.  And I only took it a brief time and have not taken it for several years.  It does not appear on items I submit but keeps appearing on your records in some places.  Please delete it from my chart     Chart correction requested:   Please delete hydrochlorothiazide from my medications listing

## 2022-12-24 ENCOUNTER — Ambulatory Visit (INDEPENDENT_AMBULATORY_CARE_PROVIDER_SITE_OTHER): Payer: Medicare PPO | Admitting: Dermatology

## 2022-12-24 ENCOUNTER — Encounter: Payer: Self-pay | Admitting: Dermatology

## 2022-12-24 VITALS — BP 118/77 | HR 67

## 2022-12-24 DIAGNOSIS — L820 Inflamed seborrheic keratosis: Secondary | ICD-10-CM | POA: Diagnosis not present

## 2022-12-24 DIAGNOSIS — L853 Xerosis cutis: Secondary | ICD-10-CM

## 2022-12-24 DIAGNOSIS — L578 Other skin changes due to chronic exposure to nonionizing radiation: Secondary | ICD-10-CM

## 2022-12-24 DIAGNOSIS — D1801 Hemangioma of skin and subcutaneous tissue: Secondary | ICD-10-CM | POA: Diagnosis not present

## 2022-12-24 DIAGNOSIS — L814 Other melanin hyperpigmentation: Secondary | ICD-10-CM

## 2022-12-24 DIAGNOSIS — L821 Other seborrheic keratosis: Secondary | ICD-10-CM

## 2022-12-24 DIAGNOSIS — L739 Follicular disorder, unspecified: Secondary | ICD-10-CM

## 2022-12-24 DIAGNOSIS — D229 Melanocytic nevi, unspecified: Secondary | ICD-10-CM

## 2022-12-24 NOTE — Progress Notes (Unsigned)
Follow-Up Visit   Subjective  Regina Evans is a 82 y.o. female who presents for the following: Annual Exam (Tbse , hx of folliculitis, mid chest and left shoulder she would like checked. ).  The patient presents for Total-Body Skin Exam (TBSE) for skin cancer screening and mole check.  The patient has spots, moles and lesions to be evaluated, some may be new or changing and the patient has concerns that these could be cancer.  The following portions of the chart were reviewed this encounter and updated as appropriate:      Review of Systems: No other skin or systemic complaints except as noted in HPI or Assessment and Plan.   Objective  Well appearing patient in no apparent distress; mood and affect are within normal limits.  A full examination was performed including scalp, head, eyes, ears, nose, lips, neck, chest, axillae, abdomen, back, buttocks, bilateral upper extremities, bilateral lower extremities, hands, feet, fingers, toes, fingernails, and toenails. All findings within normal limits unless otherwise noted below.  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 Currently well-controlled.   Continue ketoconazole shampoo 2-3x/wk as needed. pt will call for rfs Continue Clindamycin 1 % lotion apply topically to aa's 1 - 2 times daily as needed for bumps  Related Medications ketoconazole (NIZORAL) 2 % shampoo Apply 1 application topically See admin instructions. apply three times per week, massage into scalp and leave in for 10 minutes before rinsing out  Inflamed seborrheic keratosis left shoulder x 1  Symptomatic, irritating, patient would like treated.  Destruction of lesion - left shoulder x 1  Destruction method: cryotherapy   Informed consent:  discussed and consent obtained   Lesion destroyed using liquid nitrogen: Yes   Region frozen until ice ball extended beyond lesion: Yes   Outcome: patient tolerated procedure well with no complications   Post-procedure details: wound care instructions given   Additional details:  Prior to procedure, discussed risks of blister formation, small wound, skin dyspigmentation, or rare scar following cryotherapy. Recommend Vaseline ointment to treated areas while healing.   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 - Call for any changes  Seborrheic Keratoses - Stuck-on, waxy, tan-brown papules and/or plaques  - Benign-appearing - Discussed benign etiology and prognosis. - Observe - Call for any changes  Melanocytic Nevi - Tan-brown and/or pink-flesh-colored symmetric macules and papules - Benign appearing on exam today - Observation - Call clinic for new or changing moles - Recommend daily use of broad spectrum spf 30+ sunscreen to sun-exposed areas.   Hemangiomas - Red papules - Discussed benign nature - Observe - Call for any changes  Xerosis - diffuse xerotic patches - recommend gentle, hydrating skin care - gentle skin  care handout given   Actinic Damage - Chronic condition, secondary to cumulative UV/sun exposure - diffuse scaly erythematous macules with underlying dyspigmentation - Recommend daily broad  spectrum sunscreen SPF 30+ to sun-exposed areas, reapply every 2 hours as needed.  - Staying in the shade or wearing long sleeves, sun glasses (UVA+UVB protection) and wide brim hats (4-inch brim around the entire circumference of the hat) are also recommended for sun protection.  - Call for new or changing lesions.  Skin cancer screening performed today. Return in about 1 year (around 12/24/2023) for TBSE.  I, Ruthell Rummage, CMA, am acting as scribe for Brendolyn Patty, MD.

## 2022-12-24 NOTE — Patient Instructions (Addendum)
Cryotherapy Aftercare  Wash gently with soap and water everyday.   Apply Vaseline and Band-Aid daily until healed.     Seborrheic Keratosis  What causes seborrheic keratoses? Seborrheic keratoses are harmless, common skin growths that first appear during adult life.  As time goes by, more growths appear.  Some people may develop a large number of them.  Seborrheic keratoses appear on both covered and uncovered body parts.  They are not caused by sunlight.  The tendency to develop seborrheic keratoses can be inherited.  They vary in color from skin-colored to gray, brown, or even black.  They can be either smooth or have a rough, warty surface.   Seborrheic keratoses are superficial and look as if they were stuck on the skin.  Under the microscope this type of keratosis looks like layers upon layers of skin.  That is why at times the top layer may seem to fall off, but the rest of the growth remains and re-grows.    Treatment Seborrheic keratoses do not need to be treated, but can easily be removed in the office.  Seborrheic keratoses often cause symptoms when they rub on clothing or jewelry.  Lesions can be in the way of shaving.  If they become inflamed, they can cause itching, soreness, or burning.  Removal of a seborrheic keratosis can be accomplished by freezing, burning, or surgery. If any spot bleeds, scabs, or grows rapidly, please return to have it checked, as these can be an indication of a skin cancer.    Gentle Skin Care Guide  1. Bathe no more than once a day.  2. Avoid bathing in hot water  3. Use a mild soap like Dove, Vanicream, Cetaphil, CeraVe. Can use Lever 2000 or Cetaphil antibacterial soap  4. Use soap only where you need it. On most days, use it under your arms, between your legs, and on your feet. Let the water rinse other areas unless visibly dirty.  5. When you get out of the bath/shower, use a towel to gently blot your skin dry, don't rub it.  6. While  your skin is still a little damp, apply a moisturizing cream such as Vanicream, CeraVe, Cetaphil, Eucerin, Sarna lotion or plain Vaseline Jelly. For hands apply Neutrogena [place] Hand Cream or Excipial Hand Cream.  7. Reapply moisturizer any time you start to itch or feel dry.  8. Sometimes using free and clear laundry detergents can be helpful. Fabric softener sheets should be avoided. Downy Free & Gentle liquid, or any liquid fabric softener that is free of dyes and perfumes, it acceptable to use  9. If your doctor has given you prescription creams you may apply moisturizers over them   Melanoma ABCDEs  Melanoma is the most dangerous type of skin cancer, and is the leading cause of death from skin disease.  You are more likely to develop melanoma if you: Have light-colored skin, light-colored eyes, or red or blond hair Spend a lot of time in the sun Tan regularly, either outdoors or in a tanning bed Have had blistering sunburns, especially during childhood Have a close family member who has had a melanoma Have atypical moles or large birthmarks  Early detection of melanoma is key since treatment is typically straightforward and cure rates are extremely high if we catch it early.   The first sign of melanoma is often a change in a mole or a new dark spot.  The ABCDE system is a way of remembering  the signs of melanoma.  A for asymmetry:  The two halves do not match. B for border:  The edges of the growth are irregular. C for color:  A mixture of colors are present instead of an even brown color. D for diameter:  Melanomas are usually (but not always) greater than 26m - the size of a pencil eraser. E for evolution:  The spot keeps changing in size, shape, and color.  Please check your skin once per month between visits. You can use a small mirror in front and a large mirror behind you to keep an eye on the back side or your body.   If you see any new or changing lesions before your  next follow-up, please call to schedule a visit.  Please continue daily skin protection including broad spectrum sunscreen SPF 30+ to sun-exposed areas, reapplying every 2 hours as needed when you're outdoors.   Staying in the shade or wearing long sleeves, sun glasses (UVA+UVB protection) and wide brim hats (4-inch brim around the entire circumference of the hat) are also recommended for sun protection.    Due to recent changes in healthcare laws, you may see results of your pathology and/or laboratory studies on MyChart before the doctors have had a chance to review them. We understand that in some cases there may be results that are confusing or concerning to you. Please understand that not all results are received at the same time and often the doctors may need to interpret multiple results in order to provide you with the best plan of care or course of treatment. Therefore, we ask that you please give u[place]2 business days to thoroughly review all your results before contacting the office for clarification. Should we see a critical lab result, you will be contacted sooner.   If You Need Anything After Your Visit  If you have any questions or concerns for your doctor, please call our main line at [phone] press option 4 to reach your doctor's medical assistant. If no one answers, please leave a voicemail as directed and we will return your call as soon as possible. Messages left after 4 pm will be answered the following business day.   You may also send [place] message via [place] We typically respond to MyChart messages within 1-2 business days.  For prescription refills, please ask your pharmacy to contact our office. Our fax number is [phone]  If you have an urgent issue when the clinic is closed that cannot wait until the next business day, you can page your doctor at the number below.    Please note that while we do our best to be available for urgent issues outside of office  hours, we are not available 24/7.   If you have an urgent issue and are unable to reach [place] you may choose to seek medical care at your doctor's office, retail clinic, urgent care center, or emergency room.  If you have a medical emergency, please immediately call 911 or go to the emergency department.  Pager Numbers  - Dr. KNehemiah Massed [phone] - Dr. MLaurence Ferrari [phone] - Dr. SNicole Kindred [phone] In the event of inclement weather, please call our main line at [phone] an update on the status of any delays or closures.  Dermatology Medication Tips: Please keep the boxes that topical medications come in in order to help keep track of the instructions about where and how to use these. Pharmacies typically print the medication instructions  only on the boxes and not directly on the medication tubes.   If your medication is too expensive, please contact our office at [phone] option 4 or send [place] a message through [place].   We are unable to tell what your co-pay for medications will be in advance as this is different depending on your insurance coverage. However, we may be able to find a substitute medication at lower cost or fill out paperwork to get insurance to cover a needed medication.   If a prior authorization is required to get your medication covered by your insurance company, please allow [place] 1-2 business days to complete this process.  Drug prices often vary depending on where the prescription is filled and some pharmacies may offer cheaper prices.  The website www.goodrx.com contains coupons for medications through different pharmacies. The prices here do not account for what the cost may be with help from insurance (it may be cheaper with your insurance), but the website can give you the price if you did not use any insurance.  - You can print the associated coupon and take it with your prescription to the pharmacy.  - You may also stop by our office during regular  business hours and pick up a GoodRx coupon card.  - If you need your prescription sent electronically to a different pharmacy, notify our office through [facility] or by phone at [phone] option 4.     Si Usted Necesita Algo Despus de Su Visita  Tambin puede enviarnos un mensaje a travs de Pharmacist, community. Por lo general respondemos a los mensajes de MyChart en el transcurso de 1 a 2 das hbiles.  Para renovar recetas, por favor pida a su farmacia que se ponga en contacto con nuestra oficina. Harland Dingwall de fax es [place] [phone].  Si tiene un asunto urgente cuando la clnica est cerrada y que no puede esperar hasta el siguiente da hbil, puede llamar/localizar a su doctor(a) al nmero que aparece a continuacin.   Por favor, tenga en cuenta que aunque hacemos todo lo posible para estar disponibles para asuntos urgentes fuera del horario de [place], no estamos disponibles las 24 horas del da, los 7 das de la [place].   Si tiene un problema urgente y no puede comunicarse con nosotros, puede optar por buscar atencin mdica  en el consultorio de su doctor(a), en una clnica privada, en un centro de atencin urgente o en una sala de emergencias.  Si tiene Engineering geologist, por favor llame inmediatamente al 911 o vaya a la sala de emergencias.  Nmeros de bper  - Dr. Nehemiah Massed: [phone]  - Dra. Moye: [phone]  - Dra. Nicole Kindred: [phone]  En caso de inclemencias del [place], por favor llame a Johnsie Kindred principal al [phone] para una actualizacin sobre el [place] de cualquier retraso o cierre.  Consejos para la medicacin en dermatologa: Por favor, guarde las cajas en las que vienen los medicamentos de uso tpico para ayudarle a seguir las instrucciones sobre dnde y cmo usarlos. Las farmacias generalmente imprimen las instrucciones del medicamento slo en las cajas y no directamente en los tubos del [place].   Si su medicamento es muy caro, por  favor, pngase en contacto con Zigmund Daniel llamando al [phone] y presione la opcin 4 o envenos un mensaje a travs de Pharmacist, community.   No podemos decirle cul ser su copago por los medicamentos por adelantado ya que esto es diferente dependiendo de la cobertura de su seguro. Sin embargo, es  posible que podamos encontrar un medicamento sustituto a Electrical engineer un formulario para que el seguro cubra el medicamento que se considera necesario.   Si se requiere una autorizacin previa para que su compaa de seguros [place] su medicamento, por favor permtanos de 1 a 2 das hbiles para completar este proceso.  Los precios de los medicamentos varan con frecuencia dependiendo del Environmental consultant de dnde se surte la receta y alguna farmacias pueden ofrecer precios ms baratos.  El sitio web www.goodrx.com tiene cupones para medicamentos de Airline pilot. Los precios aqu no tienen en cuenta lo que podra costar con la ayuda del seguro (puede ser ms barato con su seguro), pero el sitio web puede darle el precio si no utiliz Research scientist (physical sciences).  - Puede imprimir el cupn correspondiente y llevarlo con su receta a la farmacia.  - Tambin puede pasar por nuestra oficina durante el horario de atencin regular y Charity fundraiser una tarjeta de cupones de GoodRx.  - Si necesita que su receta se enve electrnicamente a una farmacia diferente, informe a nuestra oficina a travs de MyChart de [place] o por telfono llamando al [phone] y presione la opcin 4.

## 2023-01-03 ENCOUNTER — Telehealth (HOSPITAL_COMMUNITY): Payer: Self-pay | Admitting: *Deleted

## 2023-01-03 NOTE — Telephone Encounter (Signed)
Reaching out to patient to offer assistance regarding upcoming cardiac imaging study; pt verbalizes understanding of appt date/time, parking situation and where to check in, pre-test NPO status and medications ordered, and verified current allergies; name and call back number provided for further questions should they arise  Gordy Clement RN Navigator [facility] [place] [facility] [phone] office [phone] cell  Patient reports that her HR is some times in the 50's. Advised patient to take 50mg  metoprolol tartrate if her HR is greater than 65bpm TWO hours prior to her cardiac CT scan.

## 2023-01-06 ENCOUNTER — Ambulatory Visit
Admission: RE | Admit: 2023-01-06 | Discharge: 2023-01-06 | Disposition: A | Discharge status: Home / Self Care | Payer: Medicare PPO | Source: Ambulatory Visit | Attending: Cardiology | Admitting: Cardiology

## 2023-01-06 DIAGNOSIS — R079 Chest pain, unspecified: Secondary | ICD-10-CM | POA: Diagnosis present

## 2023-01-06 DIAGNOSIS — R0602 Shortness of breath: Secondary | ICD-10-CM | POA: Diagnosis not present

## 2023-01-06 DIAGNOSIS — I700 Atherosclerosis of aorta: Secondary | ICD-10-CM | POA: Insufficient documentation

## 2023-01-06 MED ORDER — NITROGLYCERIN 0.4 MG SL SUBL
0.8000 mg | SUBLINGUAL_TABLET | Freq: Once | SUBLINGUAL | Status: AC
  Administered 2023-01-06: 0.8 mg via SUBLINGUAL

## 2023-01-06 MED ORDER — IOHEXOL 350 MG/ML SOLN
80.0000 mL | Freq: Once | INTRAVENOUS | Status: AC | PRN
  Administered 2023-01-06: 80 mL via INTRAVENOUS

## 2023-01-06 NOTE — Progress Notes (Signed)
Patient tolerated procedure well. Ambulate w/o difficulty. Denies any lightheadedness or being dizzy. Pt denies any pain at this time. Sitting in chair, pt is encouraged to drink additional water throughout the day and reason explained to patient. Patient verbalized understanding and all questions answered. ABC intact. No further needs at this time. Discharge from procedure area w/o issues.  

## 2023-01-16 ENCOUNTER — Ambulatory Visit: Payer: Medicare PPO | Admitting: Nurse Practitioner

## 2023-01-16 NOTE — Progress Notes (Signed)
Cardiology Office Note:    Date:  01/17/2023   ID:  Regina Evans, DOB 05/24/1941, MRN [id number]  PCP:  [name], MD   [place] [facility] Cardiologist:  [name], MD     Referring MD: [name], MD   CC: follow up for DOE  History of Present Illness:    Regina Evans is a 82 y.o. female with a hx of hypertension, GERD, hypothyroidism, depression, anxiety, HLD.   She established care with Dr. Garen Lah on 12/11/22 for DOE.  She noticed increased shortness of breath on her daily walks with her daughter when going uphill, that had not previously bothered her.  Then she started to notice the same sensation when she would climb her stairs at home.  Her BP was elevated, however she reported it was better managed at home and was instructed to continue to monitor at home. A coronary CTA was ordered which revealed calcium score of 0, no evidence of CAD. Echo was ordered which revealed an EF of 60-65%, no RWMA, mild LVH, grade II DD, mild MR.   She presents today accompanied by her husband for follow-up after recent testing.  She continues to endorse shortness of breath when climbing her stairs at home.  She continues to walk most days however she has not attempted to walk up the hill again.  She checks her blood pressure each evening and is concerned that her heart rate is frequently in the 50s. Reviewed previous EKGs and she has been bradycardic since  2014. She endorses some generalized fatigue however cannot associate it with anything and states possibly she is just " slowing down".  Recent blood work completed by her PCP was unrevealing, including stable TSH. She denies chest pain, palpitations, dyspnea, pnd, orthopnea, n, v, dizziness, syncope, edema, weight gain, or early satiety.   Past Medical History:  Diagnosis Date   Arthritis    Cataract    Chest pain    a. 12/2022 Cor CTA: Ca2+ = 0. No CAD. Probable hiatal hernia.   Depression    Diastolic  dysfunction    a. 11/2022 Echo: EF 60-65%, no rwma, mild LVH, GrII DD, nl RV fxn, mild MR.   Diverticulosis    no polyps in 2012 repeat colonoscopy in 5 years   Essential hypertension, benign    GERD (gastroesophageal reflux disease)    Headache(784.0)    none since hysterectomy   Osteoarthritis    hands   Osteopenia    Other anxiety states    Pure hypercholesterolemia    Unspecified glaucoma(365.9)    Unspecified hypothyroidism     Past Surgical History:  Procedure Laterality Date   EYE SURGERY     maxillary and mandibular surgery  [place]   ovaries removed;fibroids   TUBAL LIGATION     tube in right ear  2010    Current Medications: Current Meds  Medication Sig   ALPRAZolam (XANAX) 0.5 MG tablet Take 1 tablet by mouth daily as needed.   aspirin 81 MG tablet Take 81 mg by mouth daily.   citalopram (CELEXA) 20 MG tablet Take 1 tablet (20 mg total) by mouth daily.   ketoconazole (NIZORAL) 2 % shampoo Apply 1 application topically See admin instructions. apply three times per week, massage into scalp and leave in for 10 minutes before rinsing out   levothyroxine (SYNTHROID) 75 MCG tablet Take 75 mcg by mouth daily  before breakfast.   losartan (COZAAR) 100 MG tablet Take 1 tablet (100 mg total) by mouth daily.   lovastatin (MEVACOR) 20 MG tablet Take 1 tablet (20 mg total) by mouth at bedtime.   Multiple Vitamins-Minerals (PRESERVISION AREDS 2+MULTI VIT PO) Take by mouth.   timolol (TIMOPTIC-XR) 0.5 % ophthalmic gel-forming PLACE ONE DROP INTO THE LEFT EYE ONCE DAILY     Allergies:   Cefdinir   Social History   Socioeconomic History   Marital status: Married    Spouse name: Not on file   Number of children: 2   Years of education: Not on file   Highest education level: Bachelor's degree (e.g., BA, AB, BS)  Occupational History   Occupation: retired  Tobacco Use   Smoking status: Never    Passive exposure: Past    Smokeless tobacco: Never  Substance and Sexual Activity   Alcohol use: Yes    Comment: 2-5 drinks per week   Drug use: No   Sexual activity: Yes    Birth control/protection: None    Comment: number of sex partners in the last 12 months  1  Other Topics Concern   Not on file  Social History Narrative   Marital status:  Married x 55 years.       Children 2 daughters (31, 37); no grandchildren.      Lives:  With husband. Has a beach house.      Employment:  Retired.      Tobacco:  none      Alcohol:  Wine or mixed drink; 1-3 drinks five days per week.     Caffeine use 3 servings per day.       Exercise: Inactive sporadic walking.  Walking some; 2-3 times per week.      Guns in the home stored in locked cabinet. Always uses seat belts.      ADLs; independent with ADLs; no assistant devices.      Advanced Directives: yes; FULL CODE but no prolonged measures.    Social Determinants of Health   Financial Resource Strain: Low Risk  (01/13/2018)   Overall Financial Resource Strain (CARDIA)    Difficulty of Paying Living Expenses: Not hard at all  Food Insecurity: No Food Insecurity (01/13/2018)   Hunger Vital Sign    Worried About Running Out of Food in the Last Year: Never true    Ran Out of Food in the Last Year: Never true  Transportation Needs: No Transportation Needs (01/13/2018)   PRAPARE - Hydrologist (Medical): No    Lack of Transportation (Non-Medical): No  Physical Activity: Inactive (01/13/2018)   Exercise Vital Sign    Days of Exercise per Week: 0 days    Minutes of Exercise per Session: 0 min  Stress: Stress Concern Present (01/13/2018)   [place]    Feeling of Stress : To some extent  Social Connections: Somewhat Isolated (01/13/2018)   Social Connection and Isolation Panel [NHANES]    Frequency of Communication with Friends and Family: More than three times a week     Frequency of Social Gatherings with Friends and Family: More than three times a week    Attends Religious Services: Never    Marine scientist or Organizations: No    Attends Archivist Meetings: Never    Marital Status: Married     Family History: The patient's family history includes Alzheimer's disease in her  maternal grandmother and mother; Cancer in her father and paternal grandmother; Cirrhosis in her paternal grandfather; Colon polyps in her mother; Dementia in her paternal grandfather; Diabetes in her brother; Hypertension in her mother; Macular degeneration in her mother. There is no history of Breast cancer.  ROS:   Please see the history of present illness.    All other systems reviewed and are negative.  EKGs/Labs/Other Studies Reviewed:    The following studies were reviewed today:  01/06/2023 coronary CTA -  IMPRESSION: 1. Normal coronary calcium score of 0. Patient is low risk.   2. Normal coronary origin with right dominance.   3. No evidence of CAD.   4. CAD-RADS 0. Consider non-atherosclerotic causes of chest pain.  12/11/2022 echo complete -EF 60 to 65%, mild LVH, grade 2 DD, mild MR.   EKG:  EKG is not ordered today.  Reviewed EKG from 12/11/22,   Recent Labs: 12/11/2022: BUN 11; Creatinine, Ser 0.82; Potassium 4.4; Sodium 136  Recent Lipid Panel    Component Value Date/Time   CHOL 167 01/14/2018 1208   TRIG 188 (H) 01/14/2018 1208   HDL 47 01/14/2018 1208   CHOLHDL 3.6 01/14/2018 1208   CHOLHDL 3.6 04/09/2016 1347   VLDL 37 (H) 04/09/2016 1347   LDLCALC 82 01/14/2018 1208     Risk Assessment/Calculations:                Physical Exam:    VS:  BP 110/74 (BP Location: Left Arm, Patient Position: Sitting)   Pulse 73   Ht 5\' 2"  (1.575 m)   Wt 163 lb 12.8 oz (74.3 kg)   SpO2 97%   BMI 29.96 kg/m     Wt Readings from Last 3 Encounters:  01/17/23 163 lb 12.8 oz (74.3 kg)  12/11/22 163 lb 12.8 oz (74.3 kg)  04/21/18 162 lb  6.4 oz (73.7 kg)     GEN:  Well nourished, well developed in no acute distress HEENT: Normal NECK: No JVD; No carotid bruits LYMPHATICS: No lymphadenopathy CARDIAC: RRR, no murmurs, rubs, gallops RESPIRATORY:  Clear to auscultation without rales, wheezing or rhonchi  ABDOMEN: Soft, non-tender, non-distended MUSCULOSKELETAL:  No edema; No deformity  SKIN: Warm and dry NEUROLOGIC:  Alert and oriented x 3 PSYCHIATRIC:  Normal affect   ASSESSMENT:    1. Dyspnea on exertion   2. Bradycardia   3. Essential hypertension    PLAN:    In order of problems listed above:  DOE - CTA without evidence of CAD, calcium score of 0; echo EF 60 to 65%, mild LVH, grade 2 DD, mild MR. No apparent cardiac causes of DOE. She continues to walk daily without incident. Discussed referral to pulmonology, however she does not feel necessary at this time.   Bradycardia -  HR today 73. Reviewed previous EKGs dated back to 2014 and she was bradycardic consistently. She notes episodes of bradycardia when she checks her BP at night, and is concerned this could be causing her to be fatigued. Will arrange 7 day Zio.   HTN - BP today is well controlled at 110/74, continue current antihypertensive medication regimen.     Disposition - 7 day Zio, return in 6 weeks.        Medication Adjustments/Labs and Tests Ordered: Current medicines are reviewed at length with the patient today.  Concerns regarding medicines are outlined above.  Orders Placed This Encounter  Procedures   LONG TERM MONITOR (3-14 DAYS)   No orders of the defined  types were placed in this encounter.   Patient Instructions  Medication Instructions:  Your physician recommends that you continue on your current medications as directed. Please refer to the Current Medication list given to you today.  *If you need a refill on your cardiac medications before your next appointment, please call your pharmacy*   Lab Work: No labs ordered  If  you have labs (blood work) drawn today and your tests are completely normal, you will receive your results only by: [place] (if you have MyChart) OR A paper copy in the mail If you have any lab test that is abnormal or we need to change your treatment, we will call you to review the results.   Testing/Procedures: Your physician has recommended that you wear a Zio monitor.   This monitor is a medical device that records the heart's electrical activity. Doctors most often use these monitors to diagnose arrhythmias. Arrhythmias are problems with the speed or rhythm of the heartbeat. The monitor is a small device applied to your chest. You can wear one while you do your normal daily activities. While wearing this monitor if you have any symptoms to push the button and record what you felt. Once you have worn this monitor for the period of time provider prescribed (Usually 14 days), you will return the monitor device in the postage paid box. Once it is returned they will download the data collected and provide [place] with a report which the provider will then review and we will call you with those results. Important tips:  Avoid showering during the first 24 hours of wearing the monitor. Avoid excessive sweating to help maximize wear time. Do not submerge the device, no hot tubs, and no swimming pools. Keep any lotions or oils away from the patch. After 24 hours you may shower with the patch on. Take brief showers with your back facing the shower head.  Do not remove patch once it has been placed because that will interrupt data and decrease adhesive wear time. Push the button when you have any symptoms and write down what you were feeling. Once you have completed wearing your monitor, remove and place into box which has postage paid and place in your outgoing mailbox.  If for some reason you have misplaced your box then call our office and we can provide another box and/or mail it off for  you.  Follow-Up: At [facility], you and your health needs are our priority.  As part of our continuing mission to provide you with exceptional heart care, we have created designated Provider Care Teams.  These Care Teams include your primary Cardiologist (physician) and Advanced Practice Providers (APPs -  Physician Assistants and Nurse Practitioners) who all work together to provide you with the care you need, when you need it.  We recommend signing up for the patient portal called "MyChart".  Sign up information is provided on this After Visit Summary.  MyChart is used to connect with patients for Virtual Visits (Telemedicine).  Patients are able to view lab/test results, encounter notes, upcoming appointments, etc.  Non-urgent messages can be sent to your provider as well.   To learn more about what you can do with MyChart, go to [email or web address].    Your next appointment:   6 week(s)  Provider:   You may see [name], MD or one of the following Advanced Practice Providers on your designated Care Team:   Murray Hodgkins, NP Christell Faith, PA-C Cadence Kathlen Mody,  PA-C Gerrie Nordmann, NP    Signed, Trudi Ida, NP  01/17/2023 9:40 AM    [place]

## 2023-01-17 ENCOUNTER — Encounter: Payer: Self-pay | Admitting: Nurse Practitioner

## 2023-01-17 ENCOUNTER — Ambulatory Visit (INDEPENDENT_AMBULATORY_CARE_PROVIDER_SITE_OTHER): Payer: Medicare PPO

## 2023-01-17 ENCOUNTER — Ambulatory Visit: Payer: Medicare PPO | Attending: Nurse Practitioner | Admitting: Cardiology

## 2023-01-17 VITALS — BP 110/74 | HR 73 | Ht 62.0 in | Wt 163.8 lb

## 2023-01-17 DIAGNOSIS — R0609 Other forms of dyspnea: Secondary | ICD-10-CM

## 2023-01-17 DIAGNOSIS — I10 Essential (primary) hypertension: Secondary | ICD-10-CM

## 2023-01-17 DIAGNOSIS — R001 Bradycardia, unspecified: Secondary | ICD-10-CM | POA: Diagnosis not present

## 2023-01-17 NOTE — Patient Instructions (Signed)
Medication Instructions:  Your physician recommends that you continue on your current medications as directed. Please refer to the Current Medication list given to you today.  *If you need a refill on your cardiac medications before your next appointment, please call your pharmacy*   Lab Work: No labs ordered  If you have labs (blood work) drawn today and your tests are completely normal, you will receive your results only by: [place] (if you have MyChart) OR A paper copy in the mail If you have any lab test that is abnormal or we need to change your treatment, we will call you to review the results.   Testing/Procedures: Your physician has recommended that you wear a Zio monitor.   This monitor is a medical device that records the heart's electrical activity. Doctors most often use these monitors to diagnose arrhythmias. Arrhythmias are problems with the speed or rhythm of the heartbeat. The monitor is a small device applied to your chest. You can wear one while you do your normal daily activities. While wearing this monitor if you have any symptoms to push the button and record what you felt. Once you have worn this monitor for the period of time provider prescribed (Usually 14 days), you will return the monitor device in the postage paid box. Once it is returned they will download the data collected and provide [place] with a report which the provider will then review and we will call you with those results. Important tips:  Avoid showering during the first 24 hours of wearing the monitor. Avoid excessive sweating to help maximize wear time. Do not submerge the device, no hot tubs, and no swimming pools. Keep any lotions or oils away from the patch. After 24 hours you may shower with the patch on. Take brief showers with your back facing the shower head.  Do not remove patch once it has been placed because that will interrupt data and decrease adhesive wear time. Push the button when  you have any symptoms and write down what you were feeling. Once you have completed wearing your monitor, remove and place into box which has postage paid and place in your outgoing mailbox.  If for some reason you have misplaced your box then call our office and we can provide another box and/or mail it off for you.  Follow-Up: At [facility], you and your health needs are our priority.  As part of our continuing mission to provide you with exceptional heart care, we have created designated Provider Care Teams.  These Care Teams include your primary Cardiologist (physician) and Advanced Practice Providers (APPs -  Physician Assistants and Nurse Practitioners) who all work together to provide you with the care you need, when you need it.  We recommend signing up for the patient portal called "MyChart".  Sign up information is provided on this After Visit Summary.  MyChart is used to connect with patients for Virtual Visits (Telemedicine).  Patients are able to view lab/test results, encounter notes, upcoming appointments, etc.  Non-urgent messages can be sent to your provider as well.   To learn more about what you can do with MyChart, go to [email or web address].    Your next appointment:   6 week(s)  Provider:   You may see [name], MD or one of the following Advanced Practice Providers on your designated Care Team:   Murray Hodgkins, NP Christell Faith, PA-C Cadence Kathlen Mody, PA-C Gerrie Nordmann, NP

## 2023-01-21 DIAGNOSIS — R001 Bradycardia, unspecified: Secondary | ICD-10-CM

## 2023-03-03 ENCOUNTER — Ambulatory Visit: Payer: Medicare PPO | Attending: Cardiology | Admitting: Cardiology

## 2023-03-03 ENCOUNTER — Encounter: Payer: Self-pay | Admitting: Cardiology

## 2023-03-03 VITALS — BP 120/70 | HR 70 | Ht 62.0 in | Wt 164.4 lb

## 2023-03-03 DIAGNOSIS — I10 Essential (primary) hypertension: Secondary | ICD-10-CM | POA: Diagnosis not present

## 2023-03-03 DIAGNOSIS — R001 Bradycardia, unspecified: Secondary | ICD-10-CM

## 2023-03-03 DIAGNOSIS — R0609 Other forms of dyspnea: Secondary | ICD-10-CM

## 2023-03-03 DIAGNOSIS — E782 Mixed hyperlipidemia: Secondary | ICD-10-CM

## 2023-03-03 NOTE — Progress Notes (Signed)
Cardiology Office Note:    Date:  03/03/2023   ID:  Regina Evans, DOB 02/14/1941, MRN [id number]  PCP:  Regina Chick, MD   [facility] Providers Cardiologist:  Debbe Odea, MD     Referring MD: Regina Chick, MD   Chief Complaint  Patient presents with   Follow-up    F/u zio, CTA and echo no complaints today. Meds reviewed verbally with pt.    History of Present Illness:    Regina Evans is a 82 y.o. female with a hx of hypertension, hyperlipidemia who presents for follow-up.  Previously seen due to shortness of breath and bradycardia.  Workup with echo and coronary CT was unrevealing.  Cardiac monitor placed to evaluate any high degree AV block.  Presents for testing results.  Denies chest pain, shortness of breath improving.  Denies dizziness, loss presyncope or syncope.  Compliant with medications as prescribed.  No new concerns at this time.   Past Medical History:  Diagnosis Date   Arthritis    Cataract    Chest pain    a. 12/2022 Cor CTA: Ca2+ = 0. No CAD. Probable hiatal hernia.   Depression    Diastolic dysfunction    a. 11/2022 Echo: EF 60-65%, no rwma, mild LVH, GrII DD, nl RV fxn, mild MR.   Diverticulosis    no polyps in 2012 repeat colonoscopy in 5 years   Essential hypertension, benign    GERD (gastroesophageal reflux disease)    Headache(784.0)    none since hysterectomy   Osteoarthritis    hands   Osteopenia    Other anxiety states    Pure hypercholesterolemia    Unspecified glaucoma(365.9)    Unspecified hypothyroidism     Past Surgical History:  Procedure Laterality Date   EYE SURGERY     maxillary and mandibular surgery  1991   TONSILLECTOMY  1949   TOTAL ABDOMINAL HYSTERECTOMY  1991   ovaries removed;fibroids   TUBAL LIGATION     tube in right ear  2010    Current Medications: Current Meds  Medication Sig   ALPRAZolam (XANAX) 0.5 MG tablet Take 1 tablet by mouth daily as needed.   aspirin 81 MG tablet Take 81 mg by  mouth daily.   citalopram (CELEXA) 20 MG tablet Take 1 tablet (20 mg total) by mouth daily.   ketoconazole (NIZORAL) 2 % shampoo Apply 1 application topically See admin instructions. apply three times per week, massage into scalp and leave in for 10 minutes before rinsing out   levothyroxine (SYNTHROID) 75 MCG tablet Take 75 mcg by mouth daily before breakfast.   losartan (COZAAR) 100 MG tablet Take 1 tablet (100 mg total) by mouth daily.   lovastatin (MEVACOR) 40 MG tablet Take 1 tablet by mouth at bedtime.   Multiple Vitamins-Minerals (PRESERVISION AREDS 2+MULTI VIT PO) Take by mouth.   timolol (TIMOPTIC-XR) 0.5 % ophthalmic gel-forming PLACE ONE DROP INTO THE LEFT EYE ONCE DAILY     Allergies:   Cefdinir   Social History   Socioeconomic History   Marital status: Married    Spouse name: Not on file   Number of children: 2   Years of education: Not on file   Highest education level: Bachelor's degree (e.g., BA, AB, BS)  Occupational History   Occupation: retired  Tobacco Use   Smoking status: Never    Passive exposure: Past   Smokeless tobacco: Never  Substance and Sexual Activity   Alcohol use: Yes  Comment: 2-5 drinks per week   Drug use: No   Sexual activity: Yes    Birth control/protection: None    Comment: number of sex partners in the last 12 months  1  Other Topics Concern   Not on file  Social History Narrative   Marital status:  Married x 55 years.       Children 2 daughters (80, 58); no grandchildren.      Lives:  With husband. Has a beach house.      Employment:  Retired.      Tobacco:  none      Alcohol:  Wine or mixed drink; 1-3 drinks five days per week.     Caffeine use 3 servings per day.       Exercise: Inactive sporadic walking.  Walking some; 2-3 times per week.      Guns in the home stored in locked cabinet. Always uses seat belts.      ADLs; independent with ADLs; no assistant devices.      Advanced Directives: yes; FULL CODE but no prolonged  measures.    Social Determinants of Health   Financial Resource Strain: Low Risk  (01/13/2018)   Overall Financial Resource Strain (CARDIA)    Difficulty of Paying Living Expenses: Not hard at all  Food Insecurity: No Food Insecurity (01/13/2018)   Hunger Vital Sign    Worried About Running Out of Food in the Last Year: Never true    Ran Out of Food in the Last Year: Never true  Transportation Needs: No Transportation Needs (01/13/2018)   PRAPARE - Administrator, Civil Service (Medical): No    Lack of Transportation (Non-Medical): No  Physical Activity: Inactive (01/13/2018)   Exercise Vital Sign    Days of Exercise per Week: 0 days    Minutes of Exercise per Session: 0 min  Stress: Stress Concern Present (01/13/2018)   [employer] of Occupational Health - Occupational Stress Questionnaire    Feeling of Stress : To some extent  Social Connections: Somewhat Isolated (01/13/2018)   Social Connection and Isolation Panel [NHANES]    Frequency of Communication with Friends and Family: More than three times a week    Frequency of Social Gatherings with Friends and Family: More than three times a week    Attends Religious Services: Never    Database administrator or Organizations: No    Attends Engineer, structural: Never    Marital Status: Married     Family History: The patient's family history includes Alzheimer's disease in her maternal grandmother and mother; Cancer in her father and paternal grandmother; Cirrhosis in her paternal grandfather; Colon polyps in her mother; Dementia in her paternal grandfather; Diabetes in her brother; Hypertension in her mother; Macular degeneration in her mother. There is no history of Breast cancer.  ROS:   Please see the history of present illness.     All other systems reviewed and are negative.  EKGs/Labs/Other Studies Reviewed:    The following studies were reviewed today:   EKG:  EKG not ordered today.   Recent  Labs: 12/11/2022: BUN 11; Creatinine, Ser 0.82; Potassium 4.4; Sodium 136  Recent Lipid Panel    Component Value Date/Time   CHOL 167 01/14/2018 1208   TRIG 188 (H) 01/14/2018 1208   HDL 47 01/14/2018 1208   CHOLHDL 3.6 01/14/2018 1208   CHOLHDL 3.6 04/09/2016 1347   VLDL 37 (H) 04/09/2016 1347   LDLCALC 82 01/14/2018 1208  Risk Assessment/Calculations:            Physical Exam:    VS:  BP 120/70 (BP Location: Left Arm, Patient Position: Sitting, Cuff Size: Normal)   Pulse 70   Ht 5\' 2"  (1.575 m)   Wt 164 lb 6 oz (74.6 kg)   SpO2 96%   BMI 30.06 kg/m     Wt Readings from Last 3 Encounters:  03/03/23 164 lb 6 oz (74.6 kg)  01/17/23 163 lb 12.8 oz (74.3 kg)  12/11/22 163 lb 12.8 oz (74.3 kg)     GEN:  Well nourished, well developed in no acute distress HEENT: Normal NECK: No JVD; No carotid bruits CARDIAC: RRR, no murmurs, rubs, gallops RESPIRATORY:  Clear to auscultation without rales, wheezing or rhonchi  ABDOMEN: Soft, non-tender, non-distended MUSCULOSKELETAL:  No edema; No deformity  SKIN: Warm and dry NEUROLOGIC:  Alert and oriented x 3 PSYCHIATRIC:  Normal affect   ASSESSMENT:    1. Dyspnea on exertion   2. Bradycardia   3. Primary hypertension   4. Mixed hyperlipidemia    PLAN:    In order of problems listed above:  Dyspnea on exertion, echo EF 60 to 65%, impaired relaxation.  Coronary CT with no CAD. History of bradycardia, cardiac monitor with no evidence of high degree AV block, average heart rate 67, occasional paroxysmal SVT, no significant sustained arrhythmias. Hypertension, BP controlled.  Continue losartan 100 mg daily. Hyperlipidemia, continue lovastatin.  Follow-up as needed.     Medication Adjustments/Labs and Tests Ordered: Current medicines are reviewed at length with the patient today.  Concerns regarding medicines are outlined above.  No orders of the defined types were placed in this encounter.  No orders of the defined  types were placed in this encounter.   Patient Instructions  Medication Instructions:   None Ordered  *If you need a refill on your cardiac medications before your next appointment, please call your pharmacy*   Lab Work:  None Ordered  If you have labs (blood work) drawn today and your tests are completely normal, you will receive your results only by: MyChart Message (if you have MyChart) OR A paper copy in the mail If you have any lab test that is abnormal or we need to change your treatment, we will call you to review the results.   Testing/Procedures:  None Ordered   Follow-Up: At [facility], you and your health needs are our priority.  As part of our continuing mission to provide you with exceptional heart care, we have created designated Provider Care Teams.  These Care Teams include your primary Cardiologist (physician) and Advanced Practice Providers (APPs -  Physician Assistants and Nurse Practitioners) who all work together to provide you with the care you need, when you need it.  We recommend signing up for the patient portal called "MyChart".  Sign up information is provided on this After Visit Summary.  MyChart is used to connect with patients for Virtual Visits (Telemedicine).  Patients are able to view lab/test results, encounter notes, upcoming appointments, etc.  Non-urgent messages can be sent to your provider as well.   To learn more about what you can do with MyChart, go to [email or web address].    Your next appointment:   AS NEEDED    Signed, Debbe Odea, MD  03/03/2023 9:01 AM    [facility]

## 2023-03-03 NOTE — Patient Instructions (Signed)
Medication Instructions:   None Ordered  *If you need a refill on your cardiac medications before your next appointment, please call your pharmacy*   Lab Work:  None Ordered  If you have labs (blood work) drawn today and your tests are completely normal, you will receive your results only by: MyChart Message (if you have MyChart) OR A paper copy in the mail If you have any lab test that is abnormal or we need to change your treatment, we will call you to review the results.   Testing/Procedures:  None Ordered   Follow-Up: At [facility], you and your health needs are our priority.  As part of our continuing mission to provide you with exceptional heart care, we have created designated Provider Care Teams.  These Care Teams include your primary Cardiologist (physician) and Advanced Practice Providers (APPs -  Physician Assistants and Nurse Practitioners) who all work together to provide you with the care you need, when you need it.  We recommend signing up for the patient portal called "MyChart".  Sign up information is provided on this After Visit Summary.  MyChart is used to connect with patients for Virtual Visits (Telemedicine).  Patients are able to view lab/test results, encounter notes, upcoming appointments, etc.  Non-urgent messages can be sent to your provider as well.   To learn more about what you can do with MyChart, go to [email or web address].    Your next appointment:   AS NEEDED

## 2024-01-06 ENCOUNTER — Encounter: Payer: Self-pay | Admitting: Dermatology

## 2024-01-06 ENCOUNTER — Ambulatory Visit: Payer: Medicare PPO | Admitting: Dermatology

## 2024-01-06 DIAGNOSIS — L578 Other skin changes due to chronic exposure to nonionizing radiation: Secondary | ICD-10-CM | POA: Diagnosis not present

## 2024-01-06 DIAGNOSIS — L820 Inflamed seborrheic keratosis: Secondary | ICD-10-CM

## 2024-01-06 DIAGNOSIS — R238 Other skin changes: Secondary | ICD-10-CM

## 2024-01-06 DIAGNOSIS — L739 Follicular disorder, unspecified: Secondary | ICD-10-CM | POA: Diagnosis not present

## 2024-01-06 DIAGNOSIS — W908XXA Exposure to other nonionizing radiation, initial encounter: Secondary | ICD-10-CM

## 2024-01-06 DIAGNOSIS — L821 Other seborrheic keratosis: Secondary | ICD-10-CM

## 2024-01-06 DIAGNOSIS — L729 Follicular cyst of the skin and subcutaneous tissue, unspecified: Secondary | ICD-10-CM

## 2024-01-06 DIAGNOSIS — Z1283 Encounter for screening for malignant neoplasm of skin: Secondary | ICD-10-CM

## 2024-01-06 DIAGNOSIS — Z85828 Personal history of other malignant neoplasm of skin: Secondary | ICD-10-CM

## 2024-01-06 DIAGNOSIS — L814 Other melanin hyperpigmentation: Secondary | ICD-10-CM

## 2024-01-06 DIAGNOSIS — D229 Melanocytic nevi, unspecified: Secondary | ICD-10-CM

## 2024-01-06 DIAGNOSIS — D225 Melanocytic nevi of trunk: Secondary | ICD-10-CM

## 2024-01-06 MED ORDER — CLINDAMYCIN PHOSPHATE 1 % EX LOTN: TOPICAL_LOTION | CUTANEOUS | 5 refills | Status: AC

## 2024-01-06 MED ORDER — KETOCONAZOLE 2 % EX SHAM: 1.0000 | MEDICATED_SHAMPOO | CUTANEOUS | 11 refills | Status: AC

## 2024-01-06 NOTE — Progress Notes (Signed)
 Follow-Up Visit   Subjective  Regina Evans is a 83 y.o. female who presents for the following: Skin Cancer Screening and Full Body Skin Exam  The patient presents for Total-Body Skin Exam (TBSE) for skin cancer screening and mole check. The patient has spots, moles and lesions to be evaluated, some may be new or changing. History of BCC.  Some spots are itchy and irritated on shoulders.   The following portions of the chart were reviewed this encounter and updated as appropriate: medications, allergies, medical history  Review of Systems:  No other skin or systemic complaints except as noted in HPI or Assessment and Plan.  Objective  Well appearing patient in no apparent distress; mood and affect are within normal limits.  A full examination was performed including scalp, head, eyes, ears, nose, lips, neck, chest, axillae, abdomen, back, buttocks, bilateral upper extremities, bilateral lower extremities, hands, feet, fingers, toes, fingernails, and toenails. All findings within normal limits unless otherwise noted below.   Relevant physical exam findings are noted in the Assessment and Plan.  L post shoulder x 1, L med shoulder x 1, R post shoulder x 1, R shoulder x 1 (4) Erythematous stuck-on, waxy papule or plaque  Assessment & Plan   SKIN CANCER SCREENING PERFORMED TODAY.  ACTINIC DAMAGE - Chronic condition, secondary to cumulative UV/sun exposure - diffuse scaly erythematous macules with underlying dyspigmentation - Recommend daily broad spectrum sunscreen SPF 30+ to sun-exposed areas, reapply every 2 hours as needed.  - Staying in the shade or wearing long sleeves, sun glasses (UVA+UVB protection) and wide brim hats (4-inch brim around the entire circumference of the hat) are also recommended for sun protection.  - Call for new or changing lesions.  LENTIGINES, SEBORRHEIC KERATOSES (including right breast), HEMANGIOMAS (including left upper eyelid) - Benign normal skin  lesions - Benign-appearing - Call for any changes  LENTIGO Exam: R lower lip, 2 mm brown macule - stable  Benign-appearing.  Observation.  Call clinic for new or changing moles.  Recommend daily use of broad spectrum spf 30+ sunscreen to sun-exposed areas.   SEBORRHEIC KERATOSIS - R paranasal 5.0 x 4.0 mm waxy flesh papule, no changes per pt  - Benign-appearing - Discussed benign etiology and prognosis. - Observe - Call for any changes  MELANOCYTIC NEVI - Tan-brown and/or pink-flesh-colored symmetric macules and papules - L lower back 3.0 mm two tone brown macule, darker superior - R med breast 3.0 mm brown macule - R flank 3.0 x 2.0 mm medium dark brown macule - R post flank 0.6 cm brown macule - Benign appearing on exam today - Observation - Call clinic for new or changing moles - Recommend daily use of broad spectrum spf 30+ sunscreen to sun-exposed areas.   Fibrous Papule vs Cyst - 2.5 mm firm flesh white papule R alar crease without features suspicious for malignancy on dermoscopy - Benign-appearing.  Observation.  Call clinic for new or changing lesions.  - Discussed extraction, patient defers today.   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 Condition is improving with treatment but not currently at goal.   Folliculitis occurs due to inflammation of the superficial hair follicle (pore), resulting in acne-like lesions (pus bumps). It can be infectious (bacterial, fungal) or noninfectious (shaving, tight clothing, heat/sweat, medications).  Folliculitis can be acute or chronic and recommended treatment depends on the underlying cause of folliculitis.  Treatment Plan: Continue ketoconazole  shampoo 2-3x/wk as needed. pt will call for rfs Continue Clindamycin 1 % lotion apply topically to aa's 1 - 2 times daily as needed for bumps  History of Basal Cell Carcinoma of the Skin - No  evidence of recurrence today at left lower calf - Recommend regular full body skin exams - Recommend daily broad spectrum sunscreen SPF 30+ to sun-exposed areas, reapply every 2 hours as needed.  - Call if any new or changing lesions are noted between office visits   INFLAMED SEBORRHEIC KERATOSIS (4) L post shoulder x 1, L med shoulder x 1, R post shoulder x 1, R shoulder x 1 (4) Symptomatic, irritating, patient would like treated. Destruction of lesion - L post shoulder x 1, L med shoulder x 1, R post shoulder x 1, R shoulder x 1 (4)  Destruction method: cryotherapy   Informed consent: discussed and consent obtained   Lesion destroyed using liquid nitrogen: Yes   Region frozen until ice ball extended beyond lesion: Yes   Outcome: patient tolerated procedure well with no complications   Post-procedure details: wound care instructions given   Additional details:  Prior to procedure, discussed risks of blister formation, small wound, skin dyspigmentation, or rare scar following cryotherapy. Recommend Vaseline ointment to treated areas while healing.  FOLLICULITIS   Related Medications ketoconazole (NIZORAL) 2 % shampoo Apply 1 Application topically See admin instructions. apply three times per week, massage into scalp and leave in for 10 minutes before rinsing out Return in about 1 year (around 01/05/2025) for TBSE, Hx BCC.  ICherlyn Labella, CMA, am acting as scribe for Willeen Niece, MD .   Documentation: I have reviewed the above documentation for accuracy and completeness, and I agree with the above.  Willeen Niece, MD

## 2024-01-06 NOTE — Patient Instructions (Signed)
 Cryotherapy Aftercare  Wash gently with soap and water everyday.   Apply Vaseline and Band-Aid daily until healed.   Due to recent changes in healthcare laws, you may see results of your pathology and/or laboratory studies on MyChart before the doctors have had a chance to review them. We understand that in some cases there may be results that are confusing or concerning to you. Please understand that not all results are received at the same time and often the doctors may need to interpret multiple results in order to provide you with the best plan of care or course of treatment. Therefore, we ask that you please give [place] 2 business days to thoroughly review all your results before contacting the office for clarification. Should we see a critical lab result, you will be contacted sooner.   If You Need Anything After Your Visit  If you have any questions or concerns for your doctor, please call our main line at [phone] and press option 4 to reach your doctor's medical assistant. If no one answers, please leave a voicemail as directed and we will return your call as soon as possible. Messages left after 4 pm will be answered the following business day.   You may also send [place] a message via MyChart. We typically respond to MyChart messages within 1-2 business days.  For prescription refills, please ask your pharmacy to contact our office. Our fax number is [phone].  If you have an urgent issue when the clinic is closed that cannot wait until the next business day, you can page your doctor at the number below.    Please note that while we do our best to be available for urgent issues outside of office hours, we are not available 24/7.   If you have an urgent issue and are unable to reach [place], you may choose to seek medical care at your doctor's office, retail clinic, urgent care center, or emergency room.  If you have a medical emergency, please immediately call 911 or go to the emergency  department.  Pager Numbers  - Dr. Gwen Pounds: [phone]  - Dr. Roseanne Reno: [phone]  - Dr. Katrinka Blazing: [phone]   In the event of inclement weather, please call our main line at [phone] for an update on the status of any delays or closures.  Dermatology Medication Tips: Please keep the boxes that topical medications come in in order to help keep track of the instructions about where and how to use these. Pharmacies typically print the medication instructions only on the boxes and not directly on the medication tubes.   If your medication is too expensive, please contact our office at [phone] option 4 or send [place] a message through MyChart.   We are unable to tell what your co-pay for medications will be in advance as this is different depending on your insurance coverage. However, we may be able to find a substitute medication at lower cost or fill out paperwork to get insurance to cover a needed medication.   If a prior authorization is required to get your medication covered by your insurance company, please allow [place] 1-2 business days to complete this process.  Drug prices often vary depending on where the prescription is filled and some pharmacies may offer cheaper prices.  The website www.goodrx.com contains coupons for medications through different pharmacies. The prices here do not account for what the cost may be with help from insurance (it may be cheaper with your insurance), but the website can give  you the price if you did not use any insurance.  - You can print the associated coupon and take it with your prescription to the pharmacy.  - You may also stop by our office during regular business hours and pick up a GoodRx coupon card.  - If you need your prescription sent electronically to a different pharmacy, notify our office through [facility] or by phone at [phone] option 4.     Si Usted Necesita Algo Despus de Su Visita  Tambin puede enviarnos  un mensaje a travs de Clinical cytogeneticist. Por lo general respondemos a los mensajes de MyChart en el transcurso de 1 a 2 das hbiles.  Para renovar recetas, por favor pida a su farmacia que se ponga en contacto con nuestra oficina. Annie Sable de fax es [place] [phone].  Si tiene un asunto urgente cuando la clnica est cerrada y que no puede esperar hasta el siguiente da hbil, puede llamar/localizar a su doctor(a) al nmero que aparece a continuacin.   Por favor, tenga en cuenta que aunque hacemos todo lo posible para estar disponibles para asuntos urgentes fuera del horario de [place], no estamos disponibles las 24 horas del da, los 7 [address]  [address] de la [place].   Si tiene un problema urgente y no puede comunicarse con nosotros, puede optar por buscar atencin mdica  en el consultorio de su doctor(a), en una clnica privada, en un centro de atencin urgente o en una sala de emergencias.  Si tiene Engineer, drilling, por favor llame inmediatamente al 911 o vaya a la sala de emergencias.  Nmeros de bper  - Dr. Gwen Pounds: [phone]  - Dra. Roseanne Reno: [phone]  - Dr. Katrinka Blazing: [phone]   En caso de inclemencias del tiempo, por favor llame a Lacy Duverney principal al [phone] para una actualizacin sobre el [place] de cualquier retraso o cierre.  Consejos para la medicacin en dermatologa: Por favor, guarde las cajas en las que vienen los medicamentos de uso tpico para ayudarle a seguir las instrucciones sobre dnde y cmo usarlos. Las farmacias generalmente imprimen las instrucciones del medicamento slo en las cajas y no directamente en los tubos del [place].   Si su medicamento es muy caro, por favor, pngase en contacto con Rolm Gala llamando al [phone] y presione la opcin 4 o envenos un mensaje a travs de Clinical cytogeneticist.   No podemos decirle cul ser su copago por los medicamentos por adelantado ya que esto es diferente dependiendo de la cobertura de su seguro. Sin  embargo, es posible que podamos encontrar un medicamento sustituto a Audiological scientist un formulario para que el seguro cubra el medicamento que se considera necesario.   Si se requiere una autorizacin previa para que su compaa de seguros [place] su medicamento, por favor permtanos de 1 a 2 das hbiles para completar [address]  Los precios de los medicamentos varan con frecuencia dependiendo del Environmental consultant de dnde se surte la receta y alguna farmacias pueden ofrecer precios ms baratos.  El sitio web www.goodrx.com tiene cupones para medicamentos de Health and safety inspector. Los precios aqu no tienen en cuenta lo que podra costar con la ayuda del seguro (puede ser ms barato con su seguro), pero el sitio web puede darle el precio si no utiliz Tourist information centre manager.  - Puede imprimir el cupn correspondiente y llevarlo con su receta a la farmacia.  - Tambin puede pasar por nuestra oficina durante el horario de atencin regular y Education officer, museum una tarjeta de cupones de GoodRx.  -  Si necesita que su receta se enve electrnicamente a Psychiatrist, informe a nuestra oficina a travs de MyChart de [place] o por telfono llamando al [phone] y presione la opcin 4.   Melanoma ABCDEs  Melanoma is the most dangerous type of skin cancer, and is the leading cause of death from skin disease.  You are more likely to develop melanoma if you: Have light-colored skin, light-colored eyes, or red or blond hair Spend a lot of time in the sun Tan regularly, either outdoors or in a tanning bed Have had blistering sunburns, especially during childhood Have a close family member who has had a melanoma Have atypical moles or large birthmarks  Early detection of melanoma is key since treatment is typically straightforward and cure rates are extremely high if we catch it early.   The first sign of melanoma is often a change in a mole or a new dark spot.  The ABCDE system is a way of remembering the signs  of melanoma.  A for asymmetry:  The two halves do not match. B for border:  The edges of the growth are irregular. C for color:  A mixture of colors are present instead of an even brown color. D for diameter:  Melanomas are usually (but not always) greater than 6mm - the size of a pencil eraser. E for evolution:  The spot keeps changing in size, shape, and color.  Please check your skin once per month between visits. You can use a small mirror in front and a large mirror behind you to keep an eye on the back side or your body.   If you see any new or changing lesions before your next follow-up, please call to schedule a visit.  Please continue daily skin protection including broad spectrum sunscreen SPF 30+ to sun-exposed areas, reapplying every 2 hours as needed when you're outdoors.   Staying in the shade or wearing long sleeves, sun glasses (UVA+UVB protection) and wide brim hats (4-inch brim around the entire circumference of the hat) are also recommended for sun protection.

## 2024-10-07 ENCOUNTER — Other Ambulatory Visit: Payer: Self-pay | Admitting: Unknown Physician Specialty

## 2024-10-07 DIAGNOSIS — J329 Chronic sinusitis, unspecified: Secondary | ICD-10-CM

## 2024-10-08 ENCOUNTER — Encounter: Payer: Self-pay | Admitting: Unknown Physician Specialty

## 2024-10-11 ENCOUNTER — Inpatient Hospital Stay: Admission: RE | Admit: 2024-10-11

## 2024-11-01 ENCOUNTER — Ambulatory Visit
Admission: RE | Admit: 2024-11-01 | Discharge: 2024-11-01 | Disposition: A | Source: Ambulatory Visit | Attending: Unknown Physician Specialty | Admitting: Unknown Physician Specialty

## 2024-11-01 DIAGNOSIS — J329 Chronic sinusitis, unspecified: Secondary | ICD-10-CM

## 2024-11-02 ENCOUNTER — Other Ambulatory Visit

## 2025-01-31 ENCOUNTER — Encounter: Admitting: Dermatology
